# Patient Record
Sex: Male | Born: 1980
Health system: Southern US, Community
[De-identification: ages and names within clinical notes are randomized; demographics above are authoritative.]

## PROBLEM LIST (undated history)

## (undated) DIAGNOSIS — T7840XA Allergy, unspecified, initial encounter: Secondary | ICD-10-CM

## (undated) DIAGNOSIS — F32A Depression, unspecified: Secondary | ICD-10-CM

## (undated) DIAGNOSIS — F4310 Post-traumatic stress disorder, unspecified: Secondary | ICD-10-CM

## (undated) DIAGNOSIS — M1990 Unspecified osteoarthritis, unspecified site: Secondary | ICD-10-CM

## (undated) DIAGNOSIS — G4730 Sleep apnea, unspecified: Secondary | ICD-10-CM

## (undated) DIAGNOSIS — F419 Anxiety disorder, unspecified: Secondary | ICD-10-CM

## (undated) DIAGNOSIS — K219 Gastro-esophageal reflux disease without esophagitis: Secondary | ICD-10-CM

## (undated) DIAGNOSIS — F329 Major depressive disorder, single episode, unspecified: Secondary | ICD-10-CM

## (undated) HISTORY — DX: Post-traumatic stress disorder, unspecified: F43.10

## (undated) HISTORY — DX: Gastro-esophageal reflux disease without esophagitis: K21.9

## (undated) HISTORY — DX: Major depressive disorder, single episode, unspecified: F32.9

## (undated) HISTORY — DX: Unspecified osteoarthritis, unspecified site: M19.90

## (undated) HISTORY — DX: Depression, unspecified: F32.A

## (undated) HISTORY — DX: Allergy, unspecified, initial encounter: T78.40XA

## (undated) HISTORY — DX: Sleep apnea, unspecified: G47.30

## (undated) HISTORY — DX: Anxiety disorder, unspecified: F41.9

---

## 2011-03-16 ENCOUNTER — Ambulatory Visit (HOSPITAL_COMMUNITY): Payer: BC Managed Care – PPO | Admitting: Psychology

## 2011-03-16 DIAGNOSIS — F321 Major depressive disorder, single episode, moderate: Secondary | ICD-10-CM

## 2011-04-06 ENCOUNTER — Encounter (HOSPITAL_COMMUNITY): Payer: BC Managed Care – PPO | Admitting: Psychology

## 2011-04-06 DIAGNOSIS — F4325 Adjustment disorder with mixed disturbance of emotions and conduct: Secondary | ICD-10-CM

## 2011-04-27 ENCOUNTER — Encounter (HOSPITAL_COMMUNITY): Payer: BC Managed Care – PPO | Admitting: Psychology

## 2011-05-08 ENCOUNTER — Encounter (HOSPITAL_COMMUNITY): Payer: BC Managed Care – PPO | Admitting: Psychology

## 2011-05-08 DIAGNOSIS — F332 Major depressive disorder, recurrent severe without psychotic features: Secondary | ICD-10-CM

## 2011-05-22 ENCOUNTER — Encounter (HOSPITAL_COMMUNITY): Payer: BC Managed Care – PPO | Admitting: Psychology

## 2011-07-31 ENCOUNTER — Ambulatory Visit (INDEPENDENT_AMBULATORY_CARE_PROVIDER_SITE_OTHER): Payer: BC Managed Care – PPO | Admitting: Psychology

## 2011-07-31 DIAGNOSIS — F329 Major depressive disorder, single episode, unspecified: Secondary | ICD-10-CM
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
## 2011-09-04 ENCOUNTER — Ambulatory Visit (HOSPITAL_COMMUNITY): Payer: BC Managed Care – PPO | Admitting: Psychology

## 2011-10-02 ENCOUNTER — Telehealth (HOSPITAL_COMMUNITY): Payer: Self-pay | Admitting: Psychology

## 2011-10-02 NOTE — Telephone Encounter (Signed)
See telephone note.

## 2011-12-10 ENCOUNTER — Ambulatory Visit (HOSPITAL_COMMUNITY): Payer: BC Managed Care – PPO | Admitting: Psychiatry

## 2012-01-09 ENCOUNTER — Ambulatory Visit (HOSPITAL_COMMUNITY): Payer: BC Managed Care – PPO | Admitting: Psychiatry

## 2012-01-09 ENCOUNTER — Encounter (HOSPITAL_COMMUNITY): Payer: Self-pay | Admitting: Psychiatry

## 2012-01-09 DIAGNOSIS — F39 Unspecified mood [affective] disorder: Secondary | ICD-10-CM

## 2012-01-09 DIAGNOSIS — F4310 Post-traumatic stress disorder, unspecified: Secondary | ICD-10-CM
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
## 2012-01-16 ENCOUNTER — Encounter (HOSPITAL_COMMUNITY): Payer: Self-pay | Admitting: Psychology

## 2012-01-16 ENCOUNTER — Ambulatory Visit (INDEPENDENT_AMBULATORY_CARE_PROVIDER_SITE_OTHER): Payer: BC Managed Care – PPO | Admitting: Psychology

## 2012-01-16 DIAGNOSIS — F4310 Post-traumatic stress disorder, unspecified: Secondary | ICD-10-CM

## 2012-01-16 DIAGNOSIS — F39 Unspecified mood [affective] disorder: Secondary | ICD-10-CM
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
## 2012-02-19 ENCOUNTER — Telehealth (HOSPITAL_COMMUNITY): Payer: Self-pay | Admitting: *Deleted

## 2012-03-17 ENCOUNTER — Telehealth (HOSPITAL_COMMUNITY): Payer: Self-pay | Admitting: *Deleted

## 2012-03-17 ENCOUNTER — Telehealth (HOSPITAL_COMMUNITY): Payer: Self-pay

## 2012-03-17 ENCOUNTER — Other Ambulatory Visit (HOSPITAL_COMMUNITY): Payer: Self-pay | Admitting: Psychiatry

## 2012-03-17 NOTE — Telephone Encounter (Signed)
Patient states he a pretty severe - in his words - dissociative episode on Saturday 7/27.Patient states this has happened before, but not this bad. Stated he woke up Saturday afternoon on the couch, thinking he had taken an hour nap. His wife was on the phone with her mother crying. His wife told him he had been talking with her for about an hour, in the third person. He says that he has had these types of episodes in the past, but they are increasing. States he has made the hour drive home from work with no recollection of driving. He reports that he is becoming more frightened by this. He states he saw Dr.Rodenbough as recommended for testing and had a sleep study as suggested. His MD recommended a second sleep study,scheduled for September.  He would like an appointment today if possible or a phone call from Dr.Arfeen

## 2012-03-17 NOTE — Telephone Encounter (Signed)
1400:contacted patient to let him know Dr.Arfeen would not be able to see him today, but would schedule appt with him on Wednesday 7/31. Instructed patient if he feels worse before the appt he can go to the ED or come to [facility].  Patient states he will be here, but requested this writer contact his wife to tell her of Dr.Arfeen's instructions. Confirmed with patient.

## 2012-03-17 NOTE — Telephone Encounter (Signed)
Contacted patient's wife at his request. Informed spouse of pt appt on Wednesday. Wife described patient's behavior on Saturday as very strange. Stated he referred to himself as the "other Mical", and said he acted differently, more "cocky". Stated he was very different, and it frightened her that he had no memory of the conversation after he woke up. Instructed wife that Dr.Arfeen recommended pt to come to ED or [facility] if symptoms worsen between. Also instructed wife that she can call 911 if she feels pt is any danger to himself or others. Wife states she wants to make sure MD understands how strange pt's behavior was on Saturday. Advised wife that if pt agrees, she can accompany him to appt on Wednesday.

## 2012-03-17 NOTE — Telephone Encounter (Signed)
03/17/12 PT CALLED THIS MORNING STATING THAT OVER THE WEEKEND THAT HE WAS SPEAKING TO HIS WIFE IN THE THIRD PERSON AND WOKE UP. - HAD LEFT A VM ON 07/28 @ 11:17AM - I (Teddy Rebstock) SAW THAT THE PT LIVED IN [place] - INFORMED PT THAT WE HAD AN [place] OFFICE - PT WASN'T AWARE GAVE NUMBER TO CALL.Marguerite Olea

## 2012-03-18 ENCOUNTER — Other Ambulatory Visit (HOSPITAL_COMMUNITY): Payer: Self-pay | Admitting: Psychiatry

## 2012-03-18 NOTE — Telephone Encounter (Signed)
Spoke to patient.  He is coming tomorrow with his wife for his appointment.

## 2012-03-19 ENCOUNTER — Encounter (HOSPITAL_COMMUNITY): Payer: Self-pay | Admitting: Psychiatry

## 2012-03-19 ENCOUNTER — Telehealth (HOSPITAL_COMMUNITY): Payer: Self-pay | Admitting: *Deleted

## 2012-03-19 ENCOUNTER — Ambulatory Visit (INDEPENDENT_AMBULATORY_CARE_PROVIDER_SITE_OTHER): Payer: BC Managed Care – PPO | Admitting: Psychiatry

## 2012-03-19 VITALS — BP 136/91 | HR 75 | Wt 269.4 lb

## 2012-03-19 DIAGNOSIS — F39 Unspecified mood [affective] disorder: Secondary | ICD-10-CM

## 2012-03-19 DIAGNOSIS — F440 Dissociative amnesia: Secondary | ICD-10-CM

## 2012-03-19 DIAGNOSIS — F4310 Post-traumatic stress disorder, unspecified: Secondary | ICD-10-CM

## 2012-03-19 MED ORDER — PAROXETINE HCL 10 MG PO TABS: ORAL_TABLET | ORAL | Status: DC
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
## 2012-03-24 ENCOUNTER — Ambulatory Visit (INDEPENDENT_AMBULATORY_CARE_PROVIDER_SITE_OTHER): Payer: BC Managed Care – PPO | Admitting: Psychology

## 2012-03-24 DIAGNOSIS — F449 Dissociative and conversion disorder, unspecified: Secondary | ICD-10-CM

## 2012-03-24 DIAGNOSIS — F4310 Post-traumatic stress disorder, unspecified: Secondary | ICD-10-CM

## 2012-03-25 ENCOUNTER — Telehealth (HOSPITAL_COMMUNITY): Payer: Self-pay | Admitting: *Deleted

## 2012-03-25 NOTE — Telephone Encounter (Signed)
[id number] wife left VM with information to be given to Dr.Arfeen regarding her husband. Found some year old emails written by Casimiro Needle in which he made comments regarding his marriage and children. Wife interpreted them as derogatory. Does not know if this is something related to his illness or not. Expressed concern if husband found out she had told MD this information. Informed Dr.Arfeen of wife's information. Dr.Arfeen asked that wife be contacted and informed that information was received.

## 2012-03-25 NOTE — Telephone Encounter (Signed)
Left VM for wife acknowledging information.

## 2012-03-28 ENCOUNTER — Encounter (HOSPITAL_COMMUNITY): Payer: Self-pay | Admitting: Psychology
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
## 2012-04-02 ENCOUNTER — Ambulatory Visit (INDEPENDENT_AMBULATORY_CARE_PROVIDER_SITE_OTHER): Payer: BC Managed Care – PPO | Admitting: Psychiatry

## 2012-04-02 ENCOUNTER — Encounter (HOSPITAL_COMMUNITY): Payer: Self-pay | Admitting: Psychiatry

## 2012-04-02 DIAGNOSIS — F39 Unspecified mood [affective] disorder: Secondary | ICD-10-CM

## 2012-04-02 DIAGNOSIS — F4310 Post-traumatic stress disorder, unspecified: Secondary | ICD-10-CM

## 2012-04-02 DIAGNOSIS — F440 Dissociative amnesia: Secondary | ICD-10-CM

## 2012-04-02 MED ORDER — PAROXETINE HCL 30 MG PO TABS: 30.0000 mg | ORAL_TABLET | Freq: Every day | ORAL | Status: DC
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
## 2012-04-09 ENCOUNTER — Ambulatory Visit (INDEPENDENT_AMBULATORY_CARE_PROVIDER_SITE_OTHER): Payer: BC Managed Care – PPO | Admitting: Marriage and Family Therapist

## 2012-04-09 DIAGNOSIS — F449 Dissociative and conversion disorder, unspecified: Secondary | ICD-10-CM

## 2012-04-09 DIAGNOSIS — F4310 Post-traumatic stress disorder, unspecified: Secondary | ICD-10-CM
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
## 2012-04-16 ENCOUNTER — Ambulatory Visit (HOSPITAL_COMMUNITY): Payer: Self-pay | Admitting: Marriage and Family Therapist

## 2012-04-24 ENCOUNTER — Ambulatory Visit (INDEPENDENT_AMBULATORY_CARE_PROVIDER_SITE_OTHER): Payer: BC Managed Care – PPO | Admitting: Marriage and Family Therapist

## 2012-04-24 DIAGNOSIS — F449 Dissociative and conversion disorder, unspecified: Secondary | ICD-10-CM

## 2012-04-24 DIAGNOSIS — F4310 Post-traumatic stress disorder, unspecified: Secondary | ICD-10-CM
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
## 2012-04-30 ENCOUNTER — Ambulatory Visit (HOSPITAL_COMMUNITY): Payer: Self-pay | Admitting: Marriage and Family Therapist

## 2012-05-01 ENCOUNTER — Ambulatory Visit (INDEPENDENT_AMBULATORY_CARE_PROVIDER_SITE_OTHER): Payer: BC Managed Care – PPO | Admitting: Marriage and Family Therapist

## 2012-05-01 DIAGNOSIS — F4310 Post-traumatic stress disorder, unspecified: Secondary | ICD-10-CM

## 2012-05-01 DIAGNOSIS — F449 Dissociative and conversion disorder, unspecified: Secondary | ICD-10-CM
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
## 2012-05-07 ENCOUNTER — Ambulatory Visit (HOSPITAL_COMMUNITY): Payer: Self-pay | Admitting: Marriage and Family Therapist

## 2012-05-08 ENCOUNTER — Ambulatory Visit (INDEPENDENT_AMBULATORY_CARE_PROVIDER_SITE_OTHER): Payer: BC Managed Care – PPO | Admitting: Marriage and Family Therapist

## 2012-05-08 DIAGNOSIS — F4310 Post-traumatic stress disorder, unspecified: Secondary | ICD-10-CM

## 2012-05-08 DIAGNOSIS — F449 Dissociative and conversion disorder, unspecified: Secondary | ICD-10-CM
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
## 2012-05-14 ENCOUNTER — Encounter (HOSPITAL_COMMUNITY): Payer: Self-pay | Admitting: Psychiatry

## 2012-05-14 ENCOUNTER — Ambulatory Visit (INDEPENDENT_AMBULATORY_CARE_PROVIDER_SITE_OTHER): Payer: BC Managed Care – PPO | Admitting: Psychiatry

## 2012-05-14 VITALS — Wt 264.0 lb

## 2012-05-14 DIAGNOSIS — F4310 Post-traumatic stress disorder, unspecified: Secondary | ICD-10-CM

## 2012-05-14 DIAGNOSIS — F39 Unspecified mood [affective] disorder: Secondary | ICD-10-CM

## 2012-05-14 DIAGNOSIS — F440 Dissociative amnesia: Secondary | ICD-10-CM

## 2012-05-14 MED ORDER — PAROXETINE HCL 40 MG PO TABS: 40.0000 mg | ORAL_TABLET | Freq: Every day | ORAL | Status: DC
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
## 2012-05-21 ENCOUNTER — Ambulatory Visit (HOSPITAL_COMMUNITY): Payer: Self-pay | Admitting: Marriage and Family Therapist

## 2012-05-22 ENCOUNTER — Ambulatory Visit (INDEPENDENT_AMBULATORY_CARE_PROVIDER_SITE_OTHER): Payer: BC Managed Care – PPO | Admitting: Marriage and Family Therapist

## 2012-05-22 DIAGNOSIS — F4310 Post-traumatic stress disorder, unspecified: Secondary | ICD-10-CM

## 2012-05-22 DIAGNOSIS — F449 Dissociative and conversion disorder, unspecified: Secondary | ICD-10-CM
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
## 2012-05-28 ENCOUNTER — Ambulatory Visit (HOSPITAL_COMMUNITY): Payer: Self-pay | Admitting: Marriage and Family Therapist

## 2012-05-29 ENCOUNTER — Ambulatory Visit (INDEPENDENT_AMBULATORY_CARE_PROVIDER_SITE_OTHER): Payer: BC Managed Care – PPO | Admitting: Marriage and Family Therapist

## 2012-05-29 DIAGNOSIS — F4310 Post-traumatic stress disorder, unspecified: Secondary | ICD-10-CM

## 2012-05-29 DIAGNOSIS — F449 Dissociative and conversion disorder, unspecified: Secondary | ICD-10-CM

## 2012-05-29 NOTE — Progress Notes (Signed)
   THERAPIST PROGRESS NOTE  Session Time:  9:00 - 10:00 a.m.  Participation Level: Active  Behavioral Response: CasualAlertAnxious  Type of Therapy: Individual Therapy  Treatment Goals addressed: Coping  Interventions: Strength-based, Assertiveness Training, Supportive and Family Systems  Summary: Glenn Mendoza is a 31 y.o. male who presents with PTSD and dissociative disorder.  He was referred by Dr. Lolly Mustache.  Patient reports he has been keeping busy, something he does to distract himself from strong feelings.  For example, patient talked about his dissociative episodes where he would "blackout" at times for hours to days not knowing what he did.  Patient reports he feels hopeless about the blackouts i.e., "I'll never figure out what I did," but is able to distract his hopelessness by doing other things i.e., woodcutting, smoking, going on the Internet.  Patient is doing a lot of woodcutting right now and enjoying it.  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  Patient said he believed it would be cathartic for him.  Patient also states he stopped doing it because his wife discouraged his acting.    Suicidal/Homicidal: Nowithout intent/plan  Therapist Response:  Discussed patient's dissociating and educated patient about dissociation on a continuum with daydreaming on one end and blackouts becoming someone else on the other. Discussed patient stopping acting as a way to accommodate himself, something he tends to do with others.  Encouraged acting as an avenue to help patient heal from his trauma.  Also educated patient about types of communication and patient was able to identify passive-aggressive communication as his style.  Gave patient handouts on communication, conflict resolution.  Also completed a substance abuse assessment.  It appears that patient has no substance abuse problem.  He has tried marijuana four times in  college, before his children were born drank several weekends  In a row anywhere from four to six 12 oz. Beers.  He drank beer and hard liquor, with his last drink being September of 2012.  he reports never using any other substance.  Homework:  Patient will review the communication handouts.   Plan: Return again in 1 weeks.  Diagnosis: Axis I: PTSD; dissociative d/o    Axis II: Deferred    Tzivia Oneil, LMFT 05/29/2012

## 2012-06-05 ENCOUNTER — Ambulatory Visit (INDEPENDENT_AMBULATORY_CARE_PROVIDER_SITE_OTHER): Payer: BC Managed Care – PPO | Admitting: Marriage and Family Therapist

## 2012-06-05 DIAGNOSIS — F4310 Post-traumatic stress disorder, unspecified: Secondary | ICD-10-CM

## 2012-06-05 DIAGNOSIS — F449 Dissociative and conversion disorder, unspecified: Secondary | ICD-10-CM
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
## 2012-06-10 ENCOUNTER — Ambulatory Visit (HOSPITAL_COMMUNITY): Payer: Self-pay | Admitting: Marriage and Family Therapist

## 2012-06-13 ENCOUNTER — Ambulatory Visit (INDEPENDENT_AMBULATORY_CARE_PROVIDER_SITE_OTHER): Payer: BC Managed Care – PPO | Admitting: Marriage and Family Therapist

## 2012-06-13 DIAGNOSIS — F4310 Post-traumatic stress disorder, unspecified: Secondary | ICD-10-CM

## 2012-06-13 DIAGNOSIS — F449 Dissociative and conversion disorder, unspecified: Secondary | ICD-10-CM
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
## 2012-06-17 ENCOUNTER — Ambulatory Visit (HOSPITAL_COMMUNITY): Payer: Self-pay | Admitting: Marriage and Family Therapist

## 2012-06-17 ENCOUNTER — Telehealth (HOSPITAL_COMMUNITY): Payer: Self-pay | Admitting: Marriage and Family Therapist

## 2012-06-23 ENCOUNTER — Other Ambulatory Visit (HOSPITAL_COMMUNITY): Payer: Self-pay | Admitting: *Deleted

## 2012-06-23 DIAGNOSIS — F4310 Post-traumatic stress disorder, unspecified: Secondary | ICD-10-CM

## 2012-06-23 MED ORDER — PAROXETINE HCL 40 MG PO TABS: 40.0000 mg | ORAL_TABLET | Freq: Every day | ORAL | Status: DC

## 2012-06-23 NOTE — Telephone Encounter (Signed)
Authorized by [employer] for 30 days with appt reminder

## 2012-06-24 ENCOUNTER — Ambulatory Visit (INDEPENDENT_AMBULATORY_CARE_PROVIDER_SITE_OTHER): Payer: BC Managed Care – PPO | Admitting: Marriage and Family Therapist

## 2012-06-24 DIAGNOSIS — F4310 Post-traumatic stress disorder, unspecified: Secondary | ICD-10-CM

## 2012-06-24 DIAGNOSIS — F449 Dissociative and conversion disorder, unspecified: Secondary | ICD-10-CM
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
## 2012-07-01 ENCOUNTER — Ambulatory Visit (INDEPENDENT_AMBULATORY_CARE_PROVIDER_SITE_OTHER): Payer: BC Managed Care – PPO | Admitting: Marriage and Family Therapist

## 2012-07-01 DIAGNOSIS — F449 Dissociative and conversion disorder, unspecified: Secondary | ICD-10-CM

## 2012-07-01 DIAGNOSIS — F4310 Post-traumatic stress disorder, unspecified: Secondary | ICD-10-CM

## 2012-07-01 NOTE — Progress Notes (Signed)
   THERAPIST PROGRESS NOTE  Session Time:  8:00 - 9:00 a.m.  Participation Level: Active  Behavioral Response: CasualAlertAnxious  Type of Therapy: Individual Therapy  Treatment Goals addressed: Coping  Interventions: Strength-based, Assertiveness Training, Supportive and Family Systems  Summary: Glenn Mendoza is a 31 y.o. male who presents with PTSD and dissociative disorder.  He was referred by Dr. Lolly Mustache.  He did reports his anxiety has been about a "5-6" on 0-10 scale this past week.  Patient admits he does not know what it would be like to not be anxious.  Patient first reported that he is noticing that his "insides are matching his outsides."  He talked about his thinking, behavior, and feeling matching the situation he is experiencing.  He reports noticing that people around him are responding more positive than in the past as an indicator that he is "onto something."  Patient also took a great deal of the session discussing how his relationship with his wife being "cordial" and that they are in the process of writing their separation agreement.  He also reports having another overnight with his children at his parents' home.  He states he has not spoken to his wife about coming in for a session.  He also talked about his marriage specifically relating to "I know my wife is not the main reason for the breakup, that my PTSD was a big factor, but she did not make it any easier for me."     Suicidal/Homicidal: Nowithout intent/plan  Therapist Response:  Discussed dissociative disorder relating to patient splitting off thinking, behavior, and feelings in order to cope with trauma.  Discussed with patient how his wife coped with his PTSD as a "triggering agent" relating to his symptoms.  However also discussed that it was and is his responsibility to decide how he wants to react not only to his wife but also anyone else in his life.  Talked about patient balancing his life with play and  socializing and patient states he has been writing and hanging with old high school friends.  Discussed patient talking to Dr. Lolly Mustache about how he is doing and what happened to get him into therapy (what specifically wife was experiencing relating to safety).  He will be seeing Dr. Lolly Mustache the end of this week.  Plan: Return again in 1 weeks.  Diagnosis: Axis I: PTSD; Dissociative d/o    Axis II: Deferred    Jefte Carithers, LMFT 07/01/2012

## 2012-07-03 ENCOUNTER — Ambulatory Visit (HOSPITAL_COMMUNITY): Payer: BC Managed Care – PPO | Admitting: Psychiatry

## 2012-07-08 ENCOUNTER — Ambulatory Visit (INDEPENDENT_AMBULATORY_CARE_PROVIDER_SITE_OTHER): Payer: BC Managed Care – PPO | Admitting: Marriage and Family Therapist

## 2012-07-08 DIAGNOSIS — F4310 Post-traumatic stress disorder, unspecified: Secondary | ICD-10-CM

## 2012-07-08 DIAGNOSIS — F449 Dissociative and conversion disorder, unspecified: Secondary | ICD-10-CM
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
## 2012-07-09 ENCOUNTER — Encounter (HOSPITAL_COMMUNITY): Payer: Self-pay | Admitting: Psychiatry

## 2012-07-09 ENCOUNTER — Ambulatory Visit (INDEPENDENT_AMBULATORY_CARE_PROVIDER_SITE_OTHER): Payer: BC Managed Care – PPO | Admitting: Psychiatry

## 2012-07-09 DIAGNOSIS — F4310 Post-traumatic stress disorder, unspecified: Secondary | ICD-10-CM

## 2012-07-09 MED ORDER — PAROXETINE HCL 40 MG PO TABS: 40.0000 mg | ORAL_TABLET | Freq: Every day | ORAL | Status: DC
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
## 2012-07-24 ENCOUNTER — Ambulatory Visit (INDEPENDENT_AMBULATORY_CARE_PROVIDER_SITE_OTHER): Payer: BC Managed Care – PPO | Admitting: Marriage and Family Therapist

## 2012-07-24 DIAGNOSIS — F4310 Post-traumatic stress disorder, unspecified: Secondary | ICD-10-CM

## 2012-07-24 DIAGNOSIS — F449 Dissociative and conversion disorder, unspecified: Secondary | ICD-10-CM

## 2012-07-24 NOTE — Progress Notes (Signed)
   THERAPIST PROGRESS NOTE  Session Time:  9:00 - 10:00 a.m.  Participation Level: Active  Behavioral Response: CasualAlertAnxious (mild)  Type of Therapy: Individual Therapy  Treatment Goals addressed: Coping  Interventions: Strength-based and Supportive  Summary: Glenn Mendoza is a 31 y.o. male who presents with PTSD and dissociative disorder.  He was referred by Dr. Lolly Mustache.  Patient reports he has been having less and less PTSD symptoms and anxiety.  He reports he has been focusing primarily on creativity and how to access same.  He reports he will be doing a Pod-cast of one of his stories with a friend from high school and he is enjoying creating it as well as spending time with his friend.  Patient disclosed that in his marriage he cut himself off from friendships and this friend's 66 y/o son is patient's Godchild.  Patient reports he has not been a good Godfather since he has spent no time with the boy.  Suicidal/Homicidal: Negativewithout intent/plan  Therapist Response:  Discussed not writing a letter concerning patient being safe with his children but rather his need to talk to his wife about coming in for a session to discuss patient's safety.  Patient did talk to his wife and she is amenable to this idea.  Discussed patient renewing his relationship with his [name].  Also talked about the gifts patient has had because of his trauma background e.g., patient's creativity, his ability to access emotions quickly.  Suggested patient think seriously about what gifts he got from experiencing trauma.  Also talked as well about patient not defining himself by his trauma but rather look at it as something that happened not who he is.  Plan: Return again in 1 weeks.  Diagnosis: Axis I: PTSD; Dissociative d/o    Axis II: Deferred    [place] Mckeough, LMFT 07/24/2012

## 2012-07-25 ENCOUNTER — Other Ambulatory Visit (HOSPITAL_COMMUNITY): Payer: Self-pay | Admitting: Psychiatry

## 2012-07-31 ENCOUNTER — Ambulatory Visit (INDEPENDENT_AMBULATORY_CARE_PROVIDER_SITE_OTHER): Payer: BC Managed Care – PPO | Admitting: Marriage and Family Therapist

## 2012-07-31 DIAGNOSIS — F449 Dissociative and conversion disorder, unspecified: Secondary | ICD-10-CM

## 2012-07-31 DIAGNOSIS — F4310 Post-traumatic stress disorder, unspecified: Secondary | ICD-10-CM

## 2012-07-31 NOTE — Progress Notes (Signed)
   THERAPIST PROGRESS NOTE  Session Time:  8:00 - 9:00 a.m.  Participation Level: Active  Behavioral Response: CasualAlertAnxious (mild)  Type of Therapy: Individual Therapy  Treatment Goals addressed: Coping  Interventions: Strength-based, Biofeedback and Supportive  Summary: Glenn Mendoza is a 31 y.o. male who presents with PTSD and dissociative d/o.  He was referred by Dr. Lolly Mustache.  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  He reports it has been helpful to his mental health to be able to continue to do "whatever he wants to do" without worrying about what others will say.  He also reports he has started dating but is "taking it slow."  He reports this is interesting since the last time he dated he was in high school.  Suicidal/Homicidal: NAwithout intent/plan  Therapist Response:  Discussed why patient's creativity is so important at this time.  Specifically began to educated patient about biofeedback and how learning biofeedback can help patient creatively but also help patient with his anxiety and level of stress.  Gave patient brochure on biofeedback and explained the process of same.  Also discussed patient dating.  Specifically discussed patient using his assertive techniques with others, and how to trust his instincts when something does not feel right but also to follow his instincts.  Patient stated it was a good session.  Plan: Return again in 1 weeks.  Diagnosis: Axis I: PTSD; Dissociative D/O    Axis II: Deferred    [name], LMFT 07/31/2012
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
## 2012-08-07 ENCOUNTER — Ambulatory Visit (INDEPENDENT_AMBULATORY_CARE_PROVIDER_SITE_OTHER): Payer: BC Managed Care – PPO | Admitting: Marriage and Family Therapist

## 2012-08-07 DIAGNOSIS — F4310 Post-traumatic stress disorder, unspecified: Secondary | ICD-10-CM

## 2012-08-07 DIAGNOSIS — F449 Dissociative and conversion disorder, unspecified: Secondary | ICD-10-CM

## 2012-09-02 ENCOUNTER — Ambulatory Visit (INDEPENDENT_AMBULATORY_CARE_PROVIDER_SITE_OTHER): Payer: BC Managed Care – PPO | Admitting: Marriage and Family Therapist

## 2012-09-02 DIAGNOSIS — F449 Dissociative and conversion disorder, unspecified: Secondary | ICD-10-CM

## 2012-09-02 DIAGNOSIS — F4310 Post-traumatic stress disorder, unspecified: Secondary | ICD-10-CM

## 2012-09-02 NOTE — Progress Notes (Signed)
   THERAPIST PROGRESS NOTE  Session Time:  9:00 - 10:00 a.m.  Participation Level: Active  Behavioral Response: CasualAlertAnxious (mild)  Type of Therapy: Individual Therapy  Treatment Goals addressed: Coping  Interventions: Strength-based, Supportive and Family Systems  Summary: Glenn Mendoza is a 32 y.o. male who presents with  PTSD and dissociative disorder.  He was referred by Dr. Lolly Mustache.  Patient reports he has been feeling better over the past month.  He reports it is due to being back in his home.  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  He also states he and his wife are getting along well around his seeing his children.  He reports his wife told him she cannot come in due to finances but would talk on the telephone.  He talked about what they have decided relating to patient seeing his children as a long-term game plan if patient continues to get better.  Patient was 15 minutes late for his session due to work.  Suicidal/Homicidal: NAwithout intent/plan  Therapist Response:  Rated patient's symptoms:  Depression 1 on 0-10 scale; anxiety 2 (over money).  He reports no suicidal or homicidal thoughts as well as no dissociative episodes.  Talked about what being in the play could mean to his self-esteem and self-healing.  Discussed how to proceed regarding the telephone call with his wife including going over several questions she may be asking including whether or not patient is safe to be alone with the children.  Patient will sign a release for his wife.  Overall right now patient's symptoms continue to improve.  Plan: Return again in 1 weeks.  Diagnosis: Axis I: PTSD; Disociative disorder.    Axis II: Deferred    Glenn Convery, LMFT, CTS 09/02/2012

## 2012-09-08 ENCOUNTER — Ambulatory Visit (INDEPENDENT_AMBULATORY_CARE_PROVIDER_SITE_OTHER): Payer: BC Managed Care – PPO | Admitting: Psychiatry

## 2012-09-08 ENCOUNTER — Encounter (HOSPITAL_COMMUNITY): Payer: Self-pay | Admitting: Psychiatry

## 2012-09-08 DIAGNOSIS — F39 Unspecified mood [affective] disorder: Secondary | ICD-10-CM

## 2012-09-08 DIAGNOSIS — F4310 Post-traumatic stress disorder, unspecified: Secondary | ICD-10-CM

## 2012-09-08 MED ORDER — PAROXETINE HCL 40 MG PO TABS: 40.0000 mg | ORAL_TABLET | Freq: Every day | ORAL | Status: DC
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
## 2012-09-09 ENCOUNTER — Ambulatory Visit (INDEPENDENT_AMBULATORY_CARE_PROVIDER_SITE_OTHER): Payer: BC Managed Care – PPO | Admitting: Marriage and Family Therapist

## 2012-09-09 DIAGNOSIS — F4310 Post-traumatic stress disorder, unspecified: Secondary | ICD-10-CM

## 2012-09-09 DIAGNOSIS — F449 Dissociative and conversion disorder, unspecified: Secondary | ICD-10-CM

## 2012-09-09 NOTE — Progress Notes (Signed)
   THERAPIST PROGRESS NOTE  Session Time:  9:00 - 10:00 a.m.  Participation Level: Active  Behavioral Response: CasualAlertAnxious  Type of Therapy: Individual Therapy  Treatment Goals addressed: Coping  Interventions: Strength-based, Supportive and Family Systems  Summary: Glenn Mendoza is a 32 y.o. male who presents with PTSD & dissociative d/o.  He was referred by Dr. Lolly Mustache.  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  He also talked about other activities he is involving himself in having to do with his creative process.  Patient talked about having his children overnight at his parents' house for the entire weekend.  He reports his daughter only stayed one night because she was missing her mother.  He also talked about his financial situation relating to providing support for his children and giving money for extra items for the children.  He said he wanted to talk about how to provide financially in what would be fair to him and his children.  He also talked about finding a part-time job to supplement his income.  Patient talked about his appointment with Dr. Lolly Mustache.  Suicidal/Homicidal: NA  Therapist Response:  Assessed patient's level of PTSD symptoms.  According to patient they are minimal and have been for several weeks.  He is very focused on getting out and doing things and not thinking about whether or not he can do them according to having children in the house (it is more difficult to get out of the house with children).  Discussed with patient systemically how separation hurts families financially, particularly the person who has the children 24/7.  Suggested he consider looking at "what is in the best interest of the children?" versus "the money I am giving my wife."  Also talked about patient signing a  release for this writer to talk to his wife regarding her concerns about patient having the children alone.  Also talked about patient continuing to improve and the idea of him coming in every other week.  Gave information on how this process would go e.g., patient will check in on the weeks he is not here and this could be tried for one month.  Patient will think about whether or not he feels comfortable cutting down sessions to every other week.  Plan: Return again in 1 weeks.  Diagnosis: Axis I: PTSD; Dissociative d/o    Axis II: Deferred    [name], LMFT 09/09/2012

## 2012-09-09 NOTE — Telephone Encounter (Signed)
No contact - erroneous encounter 

## 2012-09-18 ENCOUNTER — Ambulatory Visit (HOSPITAL_COMMUNITY): Payer: Self-pay | Admitting: Marriage and Family Therapist

## 2012-09-25 ENCOUNTER — Ambulatory Visit (INDEPENDENT_AMBULATORY_CARE_PROVIDER_SITE_OTHER): Payer: BC Managed Care – PPO | Admitting: Marriage and Family Therapist

## 2012-09-25 DIAGNOSIS — F4310 Post-traumatic stress disorder, unspecified: Secondary | ICD-10-CM

## 2012-09-25 DIAGNOSIS — F449 Dissociative and conversion disorder, unspecified: Secondary | ICD-10-CM

## 2012-09-25 NOTE — Progress Notes (Signed)
   THERAPIST PROGRESS NOTE  Session Time:  9:00 - 10:00 a.m.  Participation Level: Active  Behavioral Response: CasualAlertAnxious (mild)  Type of Therapy: Individual Therapy  Treatment Goals addressed: Coping  Interventions: Strength-based and Supportive  Summary: Glenn Mendoza is a 32 y.o. male who presents with PTSD and dissociative disorder.  He was referred by Dr. Lolly Mustache.  Patient reports that although there are some times when he is stressed, overall he cannot believe how his life has changed for the better.  His stressors are financial and getting along with his wife during this separation.  He reports this week he filed for bankruptcy.  He reports he may have to get a part-time job on top of his full-time job.  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  He is very excited about this.  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  Overall patient has not specifically talked about his childhood trauma for some time.  Suicidal/Homicidal: NA  Therapist Response:  Assessed patient's symptoms of PTSD and dissociation and there appears to be none at this time.  Patient is living his life and enjoying what he is doing.  Did talk to him about being careful working two jobs with his mental health symptoms.  Discussed patient going down to every other week and patient says he is ready to slow down therapy.  He will if he needs to call.  Plan: Return again in 2 weeks.  Diagnosis: Axis I: PTSD; Dissociative d/o    Axis II: Deferred    Clariece Roesler, LMFT, CTS 09/25/2012

## 2012-10-02 ENCOUNTER — Ambulatory Visit (HOSPITAL_COMMUNITY): Payer: Self-pay | Admitting: Marriage and Family Therapist

## 2012-10-09 ENCOUNTER — Ambulatory Visit (INDEPENDENT_AMBULATORY_CARE_PROVIDER_SITE_OTHER): Payer: BC Managed Care – PPO | Admitting: Marriage and Family Therapist

## 2012-10-09 DIAGNOSIS — F4310 Post-traumatic stress disorder, unspecified: Secondary | ICD-10-CM

## 2012-10-09 DIAGNOSIS — F449 Dissociative and conversion disorder, unspecified: Secondary | ICD-10-CM

## 2012-10-09 NOTE — Progress Notes (Signed)
   THERAPIST PROGRESS NOTE  Session Time: 9:00 - 10:00 a.m.  Participation Level: Active  Behavioral Response: CasualAlerthappy  Type of Therapy: Individual Therapy  Treatment Goals addressed: Coping  Interventions: Strength-based and Supportive  Summary: Zyeir Dymek is a 32 y.o. male who presents with PTSD and dissociative disorder.  He was referred by Dr. Lolly Mustache.  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  He also reports meeting new people, gaining new connections, and he is having fun.  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  He will be attending the group every month.  He admits having no anxiety or feeling stressful except in trying to find time to do all of these new activities.  He did admit to feeling sadness over trying to juggle activities with seeing his children.  He talked about a year ago "wanting to run his car off a bridge" and now is the happiest he has been in a long time.  This time for him is the most satisfying he has experienced.  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    Suicidal/Homicidal: Nowithout intent/plan  Therapist Response:  Discussed how far patient has come in his recovery.  Encouraged him to continue on the path he is on.  Discussed patient's wife requesting an email relating to his progress and this writer's refusal to write an email of this nature.  Patient states he understands.  Discussed how to balance work, fun, hobbies, and family.  Overall this patient has done excellent work.  Will continue every other week.  Plan: Return again in 2 weeks.  Diagnosis: Axis I: PTSD; Dissiciative d/o    Axis II: Deferred    Tenaya Hilyer, LMFT, CTS 10/09/2012

## 2012-10-16 ENCOUNTER — Ambulatory Visit (HOSPITAL_COMMUNITY): Payer: Self-pay | Admitting: Marriage and Family Therapist

## 2012-10-30 ENCOUNTER — Ambulatory Visit (INDEPENDENT_AMBULATORY_CARE_PROVIDER_SITE_OTHER): Payer: BC Managed Care – PPO | Admitting: Marriage and Family Therapist

## 2012-10-30 NOTE — Progress Notes (Signed)
   THERAPIST PROGRESS NOTE  Session Time: 3:00 - 4:00 P.M.  Participation Level: Active  Behavioral Response: CasualAlertIrritable  Type of Therapy: Individual Therapy  Treatment Goals addressed: Coping  Interventions: Strength-based and Supportive  Summary: Glenn Mendoza is a 32 y.o. male who presents with PTSD and dissociative disorder.  He was referred by Dr. Lolly Mustache.  Patient first came in stating he was irritated.  He reports his irritation is over his wife not allowing him to see his children overnight.  Patient also states there have been some good things that have happened to him.  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  He has been able to get more hours at work which is helping him financially.  He also reports he has been dating a woman he met on the Internet for about three months.  Patient states he has met her children.   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  Assessed patient's PTSD and level of dissociation and patient states he has had not PTSD symptoms and to his knowledge has had no dissociative episodes.  Discussed how far patient has come in treatment and how he has blossomed since his separation.  Patient started to cry regarding meeting his new friend's children because of how they have warmed up to him and also how he is missing his own children.  Gave patient information on "[employer]," an on-line support group for men who are having difficulty seeing their chidren.  Plan: Return again in 2 weeks.  Diagnosis: Axis I: PTSD; Dissociative d/o    Axis II: Deferred    TOMAR,JOANIE, LMFT, CTS 10/30/2012

## 2012-11-11 ENCOUNTER — Ambulatory Visit (INDEPENDENT_AMBULATORY_CARE_PROVIDER_SITE_OTHER): Payer: BC Managed Care – PPO | Admitting: Psychiatry

## 2012-11-11 ENCOUNTER — Encounter (HOSPITAL_COMMUNITY): Payer: Self-pay | Admitting: Psychiatry

## 2012-11-11 VITALS — Wt 274.6 lb

## 2012-11-11 DIAGNOSIS — F4310 Post-traumatic stress disorder, unspecified: Secondary | ICD-10-CM

## 2012-11-11 DIAGNOSIS — Z79899 Other long term (current) drug therapy: Secondary | ICD-10-CM

## 2012-11-11 MED ORDER — PAROXETINE HCL 40 MG PO TABS
40.0000 mg | ORAL_TABLET | Freq: Every day | ORAL | Status: DC
Start: 2012-11-11 — End: 2012-12-18
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
## 2012-11-13 ENCOUNTER — Ambulatory Visit (INDEPENDENT_AMBULATORY_CARE_PROVIDER_SITE_OTHER): Payer: BC Managed Care – PPO | Admitting: Marriage and Family Therapist

## 2012-11-13 DIAGNOSIS — F449 Dissociative and conversion disorder, unspecified: Secondary | ICD-10-CM

## 2012-11-13 DIAGNOSIS — F4310 Post-traumatic stress disorder, unspecified: Secondary | ICD-10-CM
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
## 2012-11-27 ENCOUNTER — Ambulatory Visit (INDEPENDENT_AMBULATORY_CARE_PROVIDER_SITE_OTHER): Payer: BC Managed Care – PPO | Admitting: Marriage and Family Therapist

## 2012-11-27 DIAGNOSIS — F449 Dissociative and conversion disorder, unspecified: Secondary | ICD-10-CM

## 2012-11-27 DIAGNOSIS — F4310 Post-traumatic stress disorder, unspecified: Secondary | ICD-10-CM

## 2012-11-27 NOTE — Progress Notes (Signed)
   THERAPIST PROGRESS NOTE  Session Time: 2:00 - 3:00 p.m.  Participation Level: Active  Behavioral Response: CasualAlertAnxious (situational)  Type of Therapy: Individual Therapy  Treatment Goals addressed: Coping  Interventions: Strength-based and Supportive  Summary: Glenn Mendoza is a 32 y.o. male who presents with PTSD and dissociative d/o.  He was referred by Dr. Lolly Mustache.  Patient continues to reports improvement in his symptoms.  He reports there are times when he forgets to take his medication in the a.m.  He reports "feeling it" by the end of the day, more depression, not trauma symptoms.  Patient states he has not had a problem with PTSD for a long time.  He also talked about the situation with his wife and seeing the children overnight.  Patient states she finally told him he could have the children overnight at his parents' house.  However, patient admitted 10 days ago he moved in with his GF and her three children.  He states he is not sure how to break the news to his ex-wife for fear that she will not allow the children to see him.  Patient also states he has a lot of anxiety over his ex getting remarried and the children stopping to love patient.    Suicidal/Homicidal: NA  Therapist Response:  Discussed patient's progress.  Discussed the decision to move in with his GF relating to his children.  Patient asked for help in telling his ex that he has moved in with someone.  Also discussed patient having empathy towards his ex because his being in a relationship will make the divorce more real for her and she may be hurt.  Discussed the dynamics of a blended family and patient borrowed the book, "Don't Divorce [place], Advice from Kids about Divorce."  Plan: Return again in 2 weeks.  Diagnosis: Axis I: PTSD; Dissociative d/o    Axis II: Deferred    Glenn Zwilling, LMFT, CTS 11/27/2012

## 2012-12-11 ENCOUNTER — Ambulatory Visit (INDEPENDENT_AMBULATORY_CARE_PROVIDER_SITE_OTHER): Payer: BC Managed Care – PPO | Admitting: Marriage and Family Therapist

## 2012-12-11 ENCOUNTER — Telehealth (HOSPITAL_COMMUNITY): Payer: Self-pay | Admitting: *Deleted

## 2012-12-11 DIAGNOSIS — F449 Dissociative and conversion disorder, unspecified: Secondary | ICD-10-CM

## 2012-12-11 DIAGNOSIS — F4310 Post-traumatic stress disorder, unspecified: Secondary | ICD-10-CM

## 2012-12-11 NOTE — Telephone Encounter (Signed)
Contacted patient at his request (was in office earlier today to see therapist and left message for call concerning his medication). Patient states he needs to speak with MD regarding side effects of Paxil. Having sexual side effects and has read that Paxil may cause problems of this type. Although he says his mood is stable the side effects are a problem. Phone # [phone]

## 2012-12-11 NOTE — Progress Notes (Signed)
   THERAPIST PROGRESS NOTE  Session Time: 3:00 - 4:00 p.m.  Participation Level: Active  Behavioral Response: AlertAnxious  Type of Therapy: Individual Therapy  Treatment Goals addressed: Coping  Interventions: Strength-based and Supportive  Summary: Glenn Mendoza is a 32 y.o. male who presents with PTSD and dissociative disorder.  He was referred by Dr. Lolly Mustache. Patient reports that as far as work and his personal life things are running smoothly.  He states that his wife has been told he is living with someone and the reaction has been surprisingly good at first however about a week later they both had a conversation through texting about the impact of living with someone on their children.  Patient spent part of the session reading the texts between them.  Suicidal/Homicidal: No  Therapist Response:   Discussed patient that the questions his wife was asking were fair and that she was concerned about their children.  Patient stated he agreed since he could imagine what it would be like for him if and when his wife gets involved with someone.  Asked patient if he thought anything about his fear of losing his children to another man and patient stated he no longer felt that way.  Also discussed patient's tendency to "to to appease" (his words) his wife not to upset her.  He admits to having a lot of anxiety around this type of interaction "that went on throughout my marriage."  Discussed patient eventually stopping this behavior.  Finally, assessed patient for dissociative symptoms and patient repeatedly states he has had no episodes in months.  Note:  Part of the texting was about talking to this Clinical research associate about the wife, Victorino Dike, talking to this Clinical research associate regarding whether or not patient was safe around the children.  The wife did call three days ago asking same (and also requesting information regarding patient living with another woman).  Wanted to talk to patient during the session and call wife  during the session so both would hear the information being relayed.  However, patient read that the wife would be taping the conversation.  Spoke to Director Annabell Sabal about discomfort of being taped and we decided there would be no telephone call to the wife because of why she would feel the need to tape the conversation.  Spoke with patient to state same.  Suggested that if his wife wanted a psychiatric opinion to get someone out side of patient's providers to assess for same and patient stated that was a good idea.  Told him that if he needed to talk further to contact this Clinical research associate.  Plan: Return again in 2 weeks.  Diagnosis: Axis I: PTSD; Dissociative disorder    Axis II: Deferred    Shavonn Convey, LMFT, CTS 12/11/2012

## 2012-12-12 ENCOUNTER — Telehealth (HOSPITAL_COMMUNITY): Payer: Self-pay | Admitting: Psychiatry

## 2012-12-12 NOTE — Telephone Encounter (Signed)
Spoke to the patient.  Patient is concerned about the sexual side effects.  I recommend to lower the dose to 20 mg and see if the sexual side effects resolved.  I also warn him worsening of anxiety and depression in that case he needs to call us immediately.

## 2012-12-18 ENCOUNTER — Encounter (HOSPITAL_COMMUNITY): Payer: Self-pay | Admitting: Psychiatry

## 2012-12-18 ENCOUNTER — Ambulatory Visit (INDEPENDENT_AMBULATORY_CARE_PROVIDER_SITE_OTHER): Payer: BC Managed Care – PPO | Admitting: Psychiatry

## 2012-12-18 VITALS — BP 132/87 | HR 100 | Ht 72.0 in | Wt 272.0 lb

## 2012-12-18 DIAGNOSIS — F1010 Alcohol abuse, uncomplicated: Secondary | ICD-10-CM

## 2012-12-18 DIAGNOSIS — F4310 Post-traumatic stress disorder, unspecified: Secondary | ICD-10-CM

## 2012-12-18 DIAGNOSIS — F39 Unspecified mood [affective] disorder: Secondary | ICD-10-CM

## 2012-12-18 MED ORDER — BUPROPION HCL 100 MG PO TABS: 100.0000 mg | ORAL_TABLET | Freq: Every day | ORAL | Status: DC
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
## 2012-12-31 ENCOUNTER — Ambulatory Visit (INDEPENDENT_AMBULATORY_CARE_PROVIDER_SITE_OTHER): Payer: BC Managed Care – PPO | Admitting: Marriage and Family Therapist

## 2012-12-31 DIAGNOSIS — F449 Dissociative and conversion disorder, unspecified: Secondary | ICD-10-CM

## 2012-12-31 DIAGNOSIS — F4310 Post-traumatic stress disorder, unspecified: Secondary | ICD-10-CM

## 2012-12-31 NOTE — Progress Notes (Signed)
   THERAPIST PROGRESS NOTE  Session Time: Noon - 1 p.m.  Participation Level: Active  Behavioral Response: CasualAlertAnxious  Type of Therapy: Individual Therapy  Treatment Goals addressed: Coping  Interventions: Strength-based and Supportive  Summary: Glenn Mendoza is a 32 y.o. male who presents with PTSD and dissociative d/o.  He was referred by Dr. Lolly Mustache.  Patient reports his ex-wife has allowed him to have the children over the past weekend staying at his parents' house and he drove them back to her without supervision.  He reports he recently started stating what his needs have been including reiterating that the settlement agreement states if the couple is in a relationship can be introduced in two or more weeks of the relationship.  Patient also talked about work.  He has been confronted and has four warnings regarding duties patient has forgotten.  He reports his bosses have noticed that although he is good at his job he appears very distracted (his word).  He states he is now on Wellbutrin for ADHD but has not noticed any change as of yet.  He also states he is in the process of completing his bankruptcy hearing. Patient reports his new relationship with his live-in girlfriend has been going very well.  Also, patient reports no symptoms of PTSD and/or dissociation.  He believes the episode of dissociation was a "one-time event" based on his "losing it."    Suicidal/Homicidal: Nowithout intent/plan  Therapist Response:  Patient talked about the idea that his ex-wife may not be concerned about patient's safety with the children since he was allowed to have them overnight.  Discussed patient changing the way he has been addressing his needs with his ex.  Also talked at length about patient's situation with his job.  Discussed since patient's job is at risk he may want to talk to Dr. Lolly Mustache regarding medications and/or how to approach his bosses relating to his condition.  Assessed  patient's diagnosis and he admits not having any dissociative symptoms for about a year.  Will talk to Dr. Lolly Mustache about how to proceed regarding this diagnosis.     Plan: Return again in 3 weeks.  Diagnosis: Axis I: PTSD; Dissociative d/o    Axis II: Deferred    Glenn Tregoning, LMFT, CTS 12/31/2012

## 2013-01-08 ENCOUNTER — Ambulatory Visit (HOSPITAL_COMMUNITY): Payer: Self-pay | Admitting: Psychiatry

## 2013-01-17 ENCOUNTER — Other Ambulatory Visit (HOSPITAL_COMMUNITY): Payer: Self-pay | Admitting: Psychiatry

## 2013-01-18 ENCOUNTER — Other Ambulatory Visit (HOSPITAL_COMMUNITY): Payer: Self-pay | Admitting: Psychiatry

## 2013-01-18 DIAGNOSIS — F4310 Post-traumatic stress disorder, unspecified: Secondary | ICD-10-CM

## 2013-01-18 MED ORDER — BUPROPION HCL 100 MG PO TABS: 100.0000 mg | ORAL_TABLET | Freq: Every day | ORAL | Status: DC

## 2013-01-20 ENCOUNTER — Ambulatory Visit (INDEPENDENT_AMBULATORY_CARE_PROVIDER_SITE_OTHER): Payer: BC Managed Care – PPO | Admitting: Marriage and Family Therapist

## 2013-01-20 DIAGNOSIS — F4310 Post-traumatic stress disorder, unspecified: Secondary | ICD-10-CM

## 2013-01-20 NOTE — Progress Notes (Signed)
   THERAPIST PROGRESS NOTE  Session Time: 11:00 - Noon  Participation Level: Active  Behavioral Response: AlertAnxious (mild)  Type of Therapy: Individual Therapy  Treatment Goals addressed: Coping  Interventions: Strength-based and Supportive  Summary: Glenn Mendoza is a 32 y.o. male who presents with PTSD.  He was referred by Dr. Lolly Mustache.  Patient reports he has been feeling better and less distracted, particularly noticing this change at work.  He states he is also getting feedback from others that he seems more focused.  Patient states he believes it is because the Wellbutrin is working.  Patient also states that he and his ex-wife are getting along better and "finally able to come to an agreement about my having the kids unsupervised."  Patient reports his new relationship is going well and they are going to be renovating her house to make space for his two children.  Patient stated "I believe the past months have been up and down concerning getting my life together but it seems like things are finally starting to work out for me."  He reports telling his ex-wife "I can never guarantee that I will never have another psychotic episode."    Suicidal/Homicidal: Nowithout intent/plan  Therapist Response:  Assessed patient's PTSD symptoms and patient reports having no episodes.  He states believing he is doing very well.   Basically this session was about reinforcing what patient was doing to keep himself health emotionally.  Plan: Return again in 2 weeks.  Diagnosis: Axis I: PTSD    Axis II: Deferred    Glenn Kohlman, LMFT, CTS 01/20/2013

## 2013-01-26 ENCOUNTER — Telehealth (HOSPITAL_COMMUNITY): Payer: Self-pay | Admitting: *Deleted

## 2013-01-26 ENCOUNTER — Ambulatory Visit (INDEPENDENT_AMBULATORY_CARE_PROVIDER_SITE_OTHER): Payer: BC Managed Care – PPO | Admitting: Emergency Medicine

## 2013-01-26 VITALS — BP 142/83 | HR 94 | Temp 98.0°F | Resp 16 | Ht 71.0 in | Wt 274.6 lb

## 2013-01-26 DIAGNOSIS — K219 Gastro-esophageal reflux disease without esophagitis: Secondary | ICD-10-CM | POA: Insufficient documentation

## 2013-01-26 DIAGNOSIS — F329 Major depressive disorder, single episode, unspecified: Secondary | ICD-10-CM | POA: Insufficient documentation

## 2013-01-26 DIAGNOSIS — T887XXA Unspecified adverse effect of drug or medicament, initial encounter: Secondary | ICD-10-CM

## 2013-01-26 DIAGNOSIS — T50905A Adverse effect of unspecified drugs, medicaments and biological substances, initial encounter: Secondary | ICD-10-CM

## 2013-01-26 DIAGNOSIS — R21 Rash and other nonspecific skin eruption: Secondary | ICD-10-CM

## 2013-01-26 LAB — POCT URINALYSIS DIPSTICK
Bilirubin, UA: NEGATIVE
Blood, UA: NEGATIVE
Glucose, UA: NEGATIVE
Nitrite, UA: NEGATIVE
pH, UA: 6.5

## 2013-01-26 LAB — POCT CBC
HCT, POC: 51.7 % (ref 43.5–53.7)
Hemoglobin: 17.1 g/dL (ref 14.1–18.1)
Lymph, poc: 2.8 (ref 0.6–3.4)
MCH, POC: 31.3 pg — AB (ref 27–31.2)
MCHC: 33.1 g/dL (ref 31.8–35.4)
MCV: 94.6 fL (ref 80–97)
RBC: 5.46 M/uL (ref 4.69–6.13)
WBC: 8.2 10*3/uL (ref 4.6–10.2)

## 2013-01-26 MED ORDER — CETIRIZINE HCL 10 MG PO TABS: 10.0000 mg | ORAL_TABLET | Freq: Every day | ORAL | Status: DC

## 2013-01-26 MED ORDER — RANITIDINE HCL 300 MG PO TABS: 300.0000 mg | ORAL_TABLET | Freq: Every day | ORAL | Status: DC

## 2013-01-26 NOTE — Telephone Encounter (Signed)
Was started on Wellbutrin at last appointment. Developed allergic type rash across hands, feet and legs. Went to Urgent Care. They told him they had seen this before on people just starting Wellbutrin. He has discontinued the medicine on the recommendation of the MD at Urgent Care. Patient wants to discuss alternative medication when he comes for next appt 6/25.

## 2013-01-26 NOTE — Progress Notes (Signed)
[address], [place] [address]   Phone [phone]  Subjective:    Patient ID: Glenn Mendoza, male    DOB: 05/27/1981, 32 y.o.   MRN: [id number]  HPI Pt presents to clinic with 4 day h/o rash on hands and legs.  He had poison ivy about 2 weeks ago from cutting grass on his ankles but it is almost gone.  Then on Thursday night he was working with finished untreated wood and the next morning he started with this rash - the rash has gotten worse - it is very itchy and his ankles and hands are swollen and feel tight.  He has no SOB or tongue swelling.  He does not feel bad and has not had a tick bite.  He has been tapering off his Paxil and was started on the Wellbutrin about 1 month ago.  He has been using no meds to help with the rash or the itching.   Review of Systems  Constitutional: Negative for fever, chills and fatigue.  Respiratory: Negative for cough and shortness of breath.   Skin: Positive for rash.       Objective:   Physical Exam  Vitals reviewed. Constitutional: He appears well-developed and well-nourished.  HENT:  Head: Normocephalic and atraumatic.  Right Ear: External ear normal.  Left Ear: External ear normal.  Pulmonary/Chest: Effort normal.  Skin: Skin is warm and dry. Rash noted. Rash is urticarial (non-blanching rash on ankles and hands including palms and soles). Rash is not vesicular.  Non-blanching vasculitic rash on back of calves.    Psychiatric: He has a normal mood and affect. His behavior is normal. Judgment and thought content normal.   Results for orders placed in visit on 01/26/13  POCT CBC      Result Value Range   WBC 8.2  4.6 - 10.2 K/uL   Lymph, poc 2.8  0.6 - 3.4   POC LYMPH PERCENT 34.5  10 - 50 %L   MID (cbc) 0.6  0 - 0.9   POC MID % 7.1  0 - 12 %M   POC Granulocyte 4.8  2 - 6.9   Granulocyte percent 58.4  37 - 80 %G   RBC 5.46  4.69 - 6.13 M/uL   Hemoglobin 17.1  14.1 - 18.1 g/dL   HCT, POC 84.1  32.4 - 53.7 %   MCV 94.6  80 - 97  fL   MCH, POC 31.3 (*) 27 - 31.2 pg   MCHC 33.1  31.8 - 35.4 g/dL   RDW, POC 40.1     Platelet Count, POC 226  142 - 424 K/uL   MPV 9.5  0 - 99.8 fL  POCT URINALYSIS DIPSTICK      Result Value Range   Color, UA amber     Clarity, UA cloudy     Glucose, UA negative     Bilirubin, UA neg     Ketones, UA trace     Spec Grav, UA 1.025     Blood, UA neg     pH, UA 6.5     Protein, UA neg     Urobilinogen, UA 0.2     Nitrite, UA neg     Leukocytes, UA Negative          Assessment & Plan:  Rash and nonspecific skin eruption this is concerning for an allergic reaction to wellbutrin - pt to push fluids to help flush it out of his system - he has not  taken the meds today and he will not take it in the future- Plan: POCT CBC, POCT urinalysis dipstick, cetirizine (ZYRTEC) 10 MG tablet, ranitidine (ZANTAC) 300 MG tablet  Drug reaction, initial encounter - Plan: cetirizine (ZYRTEC) 10 MG tablet, ranitidine (ZANTAC) 300 MG tablet  Pt to call his psychiatrist about the probably med reaction and for them to determine the next step in his care.  Pt was seen with Dr Cleta Alberts.  Benny Lennert PA-C 01/26/2013 1:38 PM

## 2013-02-04 ENCOUNTER — Ambulatory Visit (INDEPENDENT_AMBULATORY_CARE_PROVIDER_SITE_OTHER): Payer: BC Managed Care – PPO | Admitting: Marriage and Family Therapist

## 2013-02-04 DIAGNOSIS — F4310 Post-traumatic stress disorder, unspecified: Secondary | ICD-10-CM

## 2013-02-04 NOTE — Progress Notes (Signed)
   THERAPIST PROGRESS NOTE  Session Time: 1:00 - 2:00 p.m.  Participation Level: Active  Behavioral Response: CasualAlertAnxious and Depressed (mild)  Type of Therapy: Individual Therapy  Treatment Goals addressed: Coping  Interventions: Strength-based and Supportive  Summary: Glenn Mendoza is a 32 y.o. male who presents with PTSD.  He was referred by Dr. Lolly Mustache.  Patient reports he continues to feel some depression and anxiety.  He reports some of it is due to interactions between patient and his ex-wife over getting overnight visits with the children.  Patient reports he was able to have the children meet his new girlfriend's children and that his children seemed to adjust "very well."  Patient also stated he has has no symptoms of PTSD for some time although admits he had PTSD in the past "a lot longer than I knew."  Patient reports an allergic reaction to Wellbutrin i.e., itching and swelling of the feet and hands.  He went to an urgent care where he was diagnosed and told to stop the usage.  He does not remember if he called this office to report same.  Patient states that things at work are now back to normal relating to concerns that he was having difficulty concentrating.  Suicidal/Homicidal: Nowithout intent/plan  Therapist Response:  Assessed patient's symptoms and it has been at least six months since patient has exhibited any symptoms of PTSD based on self-report, assessments, and observations.  He still admits to depression and anxiety much of which he believes is situational.  Encouraged patient to be more assertive with his ex-wife on establishing boundaries around what she expects where the children are concerned and his expectations.  Encouraged patient to continue doing outside activities in particular acting to help patient enjoy something of his own.  Plan: Return again in 2 weeks.  Diagnosis: Axis I: Post Traumatic Stress Disorder    Axis II:  Deferred    Savahna Casados, LMFT, CTS  02/04/2013

## 2013-02-11 ENCOUNTER — Encounter (HOSPITAL_COMMUNITY): Payer: Self-pay | Admitting: Psychiatry

## 2013-02-11 ENCOUNTER — Ambulatory Visit (INDEPENDENT_AMBULATORY_CARE_PROVIDER_SITE_OTHER): Payer: BC Managed Care – PPO | Admitting: Psychiatry

## 2013-02-11 VITALS — BP 110/80 | HR 95 | Ht 72.0 in | Wt 281.0 lb

## 2013-02-11 DIAGNOSIS — F39 Unspecified mood [affective] disorder: Secondary | ICD-10-CM

## 2013-02-11 DIAGNOSIS — F988 Other specified behavioral and emotional disorders with onset usually occurring in childhood and adolescence: Secondary | ICD-10-CM

## 2013-02-11 DIAGNOSIS — F1010 Alcohol abuse, uncomplicated: Secondary | ICD-10-CM

## 2013-02-11 DIAGNOSIS — F4310 Post-traumatic stress disorder, unspecified: Secondary | ICD-10-CM

## 2013-02-11 MED ORDER — METHYLPHENIDATE HCL 5 MG PO TABS: 5.0000 mg | ORAL_TABLET | Freq: Every day | ORAL | Status: DC
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
## 2013-02-18 ENCOUNTER — Ambulatory Visit (INDEPENDENT_AMBULATORY_CARE_PROVIDER_SITE_OTHER): Payer: BC Managed Care – PPO | Admitting: Marriage and Family Therapist

## 2013-02-18 DIAGNOSIS — F411 Generalized anxiety disorder: Secondary | ICD-10-CM

## 2013-02-18 DIAGNOSIS — F329 Major depressive disorder, single episode, unspecified: Secondary | ICD-10-CM

## 2013-02-18 DIAGNOSIS — F4310 Post-traumatic stress disorder, unspecified: Secondary | ICD-10-CM

## 2013-02-18 NOTE — Progress Notes (Signed)
   THERAPIST PROGRESS NOTE  Session Time:  3:00 - 4:00 p.m.  Participation Level: Active  Behavioral Response: CasualAlertAnxious (mild)  Type of Therapy: Individual Therapy  Treatment Goals addressed: Coping  Interventions: Strength-based and Supportive  Summary: Glenn Mendoza is a 32 y.o. male who presents with depression and anxiety.  He was referred by Dr. Lolly Mustache.  Patient reports he is doing well.  He states he is happy, at times having some depression and anxiety.  Patient reports he and his girlfriend have been working on getting their house ready for patient's children to stay.  He has completed the bathroom.  He reports his ex is letting him have the children next weekend overnight where he is living.  He states he confronted his ex, one of the first times he has confronted her.  He also states work has calmed down relating to patient's inattentiveness.  Patient states he is now on 5 mg of Ritalin.  He says he cannot tell if it is working but he does feel less distracted.  He is now of 20 mg of Paxel versus 40 mg.  He stopped taking the Wellbutrin due to side effects.  He saw Dr. Lolly Mustache last week relating to same.  Suicidal/Homicidal: NA  Therapist Response:  Discussed patient's diagnosis with him relating to improvement in symptoms.  Will be diagnosing with Depression NOS and GAD.  Will discuss with doctor patient's diagnosis of PTSD.  Commended patient for being assertive with his ex.  Will discuss with patient slowing down his session and/or making a plan to slow down his sessions if he continues to improve.  Plan: Return again in 2 weeks.  Diagnosis: Axis I: Depression NOS; GAD; PTSD    Axis II: Deferred    Wymon Swaney, LMFT, CTS 02/18/2013

## 2013-03-04 ENCOUNTER — Ambulatory Visit (INDEPENDENT_AMBULATORY_CARE_PROVIDER_SITE_OTHER): Payer: BC Managed Care – PPO | Admitting: Marriage and Family Therapist

## 2013-03-04 DIAGNOSIS — F39 Unspecified mood [affective] disorder: Secondary | ICD-10-CM

## 2013-03-04 NOTE — Progress Notes (Signed)
   THERAPIST PROGRESS NOTE  Session Time:  3:00 - 4:00 p.m.  Participation Level: Active  Behavioral Response: CasualAlert  Type of Therapy: Individual Therapy  Treatment Goals addressed: Coping  Interventions: Strength-based and Supportive  Summary: [name] is a 32 y.o. male who presents with depression and PTSD in remission.  He was referred by Dr. Lolly Mustache.  Patient reports this is the happiest he has been in a long time.  He reports for the first time he will be able to have his children overnight this weekend in his new home with his girlfriend.  He reports he and his ex have been able to come up with a plan for him to have them overnight.  Patient states he and his girlfriend have been working to complete the third floor to have bedrooms and a bathroom for his children.  He also reports he and his girlfriend are "very much alike" and they tell each other, "can you believe how we lucked out finding each other?"  He reports going to social events like attending a trivia night at a [employer] with his girlfriends and her friends.  He also reports things at work have been going well and that he reported not episodes having to do with patient having a problem focusing.    Suicidal/Homicidal: NA - No necessity to assess for SI/HI at this time.  Therapist Response:  Assessed for PTSD and again patient reports no symptoms.  Even so will continue to assess for same.  Patient is having some depression but mainly about the situation with his children at times.  He admits thinking he was an introvert, "not liking being around others," but now that he is out of his marriage admits he forgot that he does tend to like being around other people.  Discussed patient trying to go down to monthly.  Discussed his putting a plan in place to make sure he is using his support during this time.  Patient agreed he would talk to his girlfriend but admits the first support he goes to is his family,  particularly his sister.  Patient also agreed in the next month he will contact this writer around the first week in August to "check in."  Note:  This Clinical research associate discussed this plan with Dr. Celene Kras after the session.  Plan: Return again in one month.  Diagnosis: Axis I: Mood d/o NOS; PTSD    Axis II: Deferred    Tyasia Packard, LMFT, CTSD 03/04/2013

## 2013-03-16 ENCOUNTER — Ambulatory Visit (HOSPITAL_COMMUNITY): Payer: Self-pay | Admitting: Psychiatry

## 2013-03-18 ENCOUNTER — Ambulatory Visit (HOSPITAL_COMMUNITY): Payer: Self-pay | Admitting: Marriage and Family Therapist

## 2013-03-25 ENCOUNTER — Ambulatory Visit (INDEPENDENT_AMBULATORY_CARE_PROVIDER_SITE_OTHER): Payer: BC Managed Care – PPO | Admitting: Psychiatry

## 2013-03-25 ENCOUNTER — Encounter (HOSPITAL_COMMUNITY): Payer: Self-pay | Admitting: Psychiatry

## 2013-03-25 VITALS — BP 128/71 | HR 92 | Ht 72.0 in | Wt 285.0 lb

## 2013-03-25 DIAGNOSIS — F39 Unspecified mood [affective] disorder: Secondary | ICD-10-CM

## 2013-03-25 DIAGNOSIS — F4310 Post-traumatic stress disorder, unspecified: Secondary | ICD-10-CM

## 2013-03-25 DIAGNOSIS — F988 Other specified behavioral and emotional disorders with onset usually occurring in childhood and adolescence: Secondary | ICD-10-CM

## 2013-03-25 DIAGNOSIS — F1010 Alcohol abuse, uncomplicated: Secondary | ICD-10-CM

## 2013-03-25 MED ORDER — LISDEXAMFETAMINE DIMESYLATE 20 MG PO CAPS: 20.0000 mg | ORAL_CAPSULE | ORAL | Status: DC

## 2013-03-25 MED ORDER — PAROXETINE HCL 20 MG PO TABS: 20.0000 mg | ORAL_TABLET | ORAL | Status: DC
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
## 2013-04-01 ENCOUNTER — Ambulatory Visit (INDEPENDENT_AMBULATORY_CARE_PROVIDER_SITE_OTHER): Payer: BC Managed Care – PPO | Admitting: Marriage and Family Therapist

## 2013-04-01 DIAGNOSIS — F329 Major depressive disorder, single episode, unspecified: Secondary | ICD-10-CM

## 2013-04-01 DIAGNOSIS — F3289 Other specified depressive episodes: Secondary | ICD-10-CM

## 2013-04-01 NOTE — Progress Notes (Signed)
   THERAPIST PROGRESS NOTE  Session Time:  3:00 - 4:00 p.m.  Participation Level: Active  Behavioral Response: CasualAlertDepressed (mild)  Type of Therapy: Individual Therapy  Treatment Goals addressed: Coping  Interventions: Strength-based and Supportive  Summary: Glenn Mendoza is a 32 y.o. male who presents with depression.  He was referred by Dr. Lolly Mustache.  Patient reports things are going well for him relating to his personal life.  He reports being with his new girlfriend has been very good for him and things with his ex have been stable.  He did however state that he is having difficulties at work.  He was called in for a meeting because he has been making mistakes at work again.  He also reports his boss was ready to fire him over another mistake but after talking to patient decided to give him another chance.  Patient states he is worried about his finances should he be fired and does not know how he will make the kind of money he makes at his job elsewhere.  Suicidal/Homicidal: Nowithout intent/plan  Therapist Response:  Discussed patient's job situation at length to find out why he is has been making mistakes.  Patient was able to identify he is bored at work, and needs to do something with more creativity.  He admits being bored at work has happened in the past.  Through the discussion patient was able to come up with a plan where he will call a few resources for other jobs.  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  Patient stated he felt a lot better about his job situation after this discussion.  Plan: Return again in 1 month.  Diagnosis: Axis I:  Depressive d/o NOS    Axis II: Deferred    Lowella Kindley, LMFT, CTS 04/01/2013

## 2013-04-15 ENCOUNTER — Ambulatory Visit (HOSPITAL_COMMUNITY): Payer: Self-pay | Admitting: Marriage and Family Therapist

## 2013-04-28 ENCOUNTER — Ambulatory Visit (INDEPENDENT_AMBULATORY_CARE_PROVIDER_SITE_OTHER): Payer: BC Managed Care – PPO | Admitting: Psychiatry

## 2013-04-28 ENCOUNTER — Encounter (HOSPITAL_COMMUNITY): Payer: Self-pay | Admitting: Psychiatry

## 2013-04-28 VITALS — BP 118/78 | HR 80 | Ht 72.0 in | Wt 282.0 lb

## 2013-04-28 DIAGNOSIS — F39 Unspecified mood [affective] disorder: Secondary | ICD-10-CM

## 2013-04-28 DIAGNOSIS — F4310 Post-traumatic stress disorder, unspecified: Secondary | ICD-10-CM

## 2013-04-28 DIAGNOSIS — F988 Other specified behavioral and emotional disorders with onset usually occurring in childhood and adolescence: Secondary | ICD-10-CM

## 2013-04-28 DIAGNOSIS — F1010 Alcohol abuse, uncomplicated: Secondary | ICD-10-CM

## 2013-04-28 MED ORDER — LISDEXAMFETAMINE DIMESYLATE 20 MG PO CAPS
20.0000 mg | ORAL_CAPSULE | ORAL | Status: DC
Start: 2013-04-28 — End: 2013-06-29

## 2013-04-28 MED ORDER — PAROXETINE HCL 20 MG PO TABS: 20.0000 mg | ORAL_TABLET | ORAL | Status: DC

## 2013-04-28 MED ORDER — LISDEXAMFETAMINE DIMESYLATE 20 MG PO CAPS: 20.0000 mg | ORAL_CAPSULE | ORAL | Status: DC
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
## 2013-04-29 ENCOUNTER — Ambulatory Visit (INDEPENDENT_AMBULATORY_CARE_PROVIDER_SITE_OTHER): Payer: BC Managed Care – PPO | Admitting: Marriage and Family Therapist

## 2013-04-29 DIAGNOSIS — F329 Major depressive disorder, single episode, unspecified: Secondary | ICD-10-CM

## 2013-04-29 NOTE — Progress Notes (Signed)
   THERAPIST PROGRESS NOTE  Session Time:  3:00 - 4:00 p.m.  Participation Level: Active  Behavioral Response: CasualAlertNo depression  Type of Therapy: Individual Therapy  Treatment Goals addressed: Coping  Interventions: Strength-based and Supportive  Summary: Glenn Mendoza is a 32 y.o. male who presents with depression.  He was referred by Dr. Lolly Mustache.  Patient reports he is doing very well.  He reports looking back over the past month and with the exception of having financial worries he has not been depressed.  Patient states he was not even depressed about money, more "what am I going to do?"  He admits he responded by applying for several jobs and thinking of various ways to make money.  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  He talked about how his job is going and he did talk to someone who owns a [employer] company about working there but the man told him he had no jobs at this time but would keep patient in mind.  He reports his relationship with his girlfriend remains excellent and he filed for divorce.  He reports there has been some conflict with his wife but he is being assertive with her.  Patient states he is "tired of having negativity in my life."  Suicidal/Homicidal: NA  Therapist Response:  Assessed patient's symptoms and based on the information, how he is handling his life, and body language he appears to not be depressed and stated he was "happy."  Talked about where to go from here regarding therapy.  Patient will think about whether or not he wants to stop therapy after his next session or continue to come in for monthly maintenance.  Plan: Return again in 1 month.  Diagnosis: Axis I: Depressive d/o, NOS    Axis II: Deferred    Tiffnay Bossi, LMFT, CTS 04/29/2013

## 2013-05-13 ENCOUNTER — Ambulatory Visit (HOSPITAL_COMMUNITY): Payer: Self-pay | Admitting: Marriage and Family Therapist

## 2013-06-16 ENCOUNTER — Encounter (INDEPENDENT_AMBULATORY_CARE_PROVIDER_SITE_OTHER): Payer: Self-pay

## 2013-06-16 ENCOUNTER — Ambulatory Visit (INDEPENDENT_AMBULATORY_CARE_PROVIDER_SITE_OTHER): Payer: BC Managed Care – PPO | Admitting: Marriage and Family Therapist

## 2013-06-16 DIAGNOSIS — F329 Major depressive disorder, single episode, unspecified: Secondary | ICD-10-CM

## 2013-06-17 NOTE — Progress Notes (Signed)
   THERAPIST PROGRESS NOTE  Session Time:  3:00 - 4:00 p.m.  Participation Level: Active  Behavioral Response: CasualAlertNo symptoms at time of session.  Type of Therapy: Individual Therapy  Treatment Goals addressed: Coping/Termination  Interventions: Strength-based and Supportive  Summary: Glenn Mendoza is a 32 y.o. male who presents with depression.  He was referred by Dr. Lolly Mustache.  Patient reports he is doing well.  He reports his life is good, particularly his home life.  He reports his girlfriend is supportive and the children have been getting along, that his children are adjusting to the new life, particularly his four-year-old daughter.  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  Patient states after the first of the year he is going to talk to his boss and hopefully he will be able to receive additional sums promised to him with this change.  He reports however having money problems but states his girlfriend is very supportive in this way.  He reports things are going well with his ex and that they are coparenting well.    Suicidal/Homicidal: N/A at time of the session  Therapist Response:  Assessed any symptoms of PTSD, dissociative, and depression.  Patient has been able to stay stable for more than three months.  Discussed patient's treatment plan as a review for termination.  Patient states initially his goal for therapy was "to not jump off of a bridge."  Patient states he is "lightyears from that thought" and cannot imagine he was thinking in this way.  Discussed patient meeting all of his treatment goals.  Discussed what termination means in relationship that if he has a problem of any kind he can always make another appointment.   Discussed the process patient has made in therapy in such a short period of time considering his background.  Commended him for working hard at getting better.     Plan: Return again in PRN.  Diagnosis: Axis I: Depressive Disorder NOS    Axis II: Deferred    Furman Trentman, LMFT, CTS 06/17/2013

## 2013-06-29 ENCOUNTER — Encounter (HOSPITAL_COMMUNITY): Payer: Self-pay | Admitting: Psychiatry

## 2013-06-29 ENCOUNTER — Ambulatory Visit (INDEPENDENT_AMBULATORY_CARE_PROVIDER_SITE_OTHER): Payer: BC Managed Care – PPO | Admitting: Psychiatry

## 2013-06-29 VITALS — Wt 283.0 lb

## 2013-06-29 DIAGNOSIS — F4310 Post-traumatic stress disorder, unspecified: Secondary | ICD-10-CM

## 2013-06-29 DIAGNOSIS — F39 Unspecified mood [affective] disorder: Secondary | ICD-10-CM

## 2013-06-29 DIAGNOSIS — F1010 Alcohol abuse, uncomplicated: Secondary | ICD-10-CM

## 2013-06-29 MED ORDER — PAROXETINE HCL 20 MG PO TABS: 20.0000 mg | ORAL_TABLET | ORAL | Status: DC
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
## 2013-09-29 ENCOUNTER — Encounter (HOSPITAL_COMMUNITY): Payer: Self-pay | Admitting: Psychiatry

## 2013-09-29 ENCOUNTER — Ambulatory Visit (INDEPENDENT_AMBULATORY_CARE_PROVIDER_SITE_OTHER): Payer: BC Managed Care – PPO | Admitting: Psychiatry

## 2013-09-29 VITALS — BP 124/86 | HR 96 | Ht 71.0 in | Wt 282.0 lb

## 2013-09-29 DIAGNOSIS — F4310 Post-traumatic stress disorder, unspecified: Secondary | ICD-10-CM

## 2013-09-29 DIAGNOSIS — F39 Unspecified mood [affective] disorder: Secondary | ICD-10-CM

## 2013-09-29 DIAGNOSIS — F1010 Alcohol abuse, uncomplicated: Secondary | ICD-10-CM
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
## 2013-12-28 ENCOUNTER — Ambulatory Visit (HOSPITAL_COMMUNITY): Payer: Self-pay | Admitting: Psychiatry

## 2014-10-19 ENCOUNTER — Ambulatory Visit (INDEPENDENT_AMBULATORY_CARE_PROVIDER_SITE_OTHER): Payer: BLUE CROSS/BLUE SHIELD | Admitting: Family Medicine

## 2014-10-19 VITALS — BP 130/74 | HR 85 | Temp 98.2°F | Resp 15 | Ht 72.0 in | Wt 284.8 lb

## 2014-10-19 DIAGNOSIS — M542 Cervicalgia: Secondary | ICD-10-CM | POA: Diagnosis not present

## 2014-10-19 DIAGNOSIS — L0390 Cellulitis, unspecified: Secondary | ICD-10-CM | POA: Diagnosis not present

## 2014-10-19 DIAGNOSIS — L0291 Cutaneous abscess, unspecified: Secondary | ICD-10-CM | POA: Diagnosis not present

## 2014-10-19 MED ORDER — CEFTRIAXONE SODIUM 1 G IJ SOLR
1.0000 g | Freq: Once | INTRAMUSCULAR | Status: AC
  Administered 2014-10-19: 1 g via INTRAMUSCULAR

## 2014-10-19 MED ORDER — TRAMADOL HCL 50 MG PO TABS
50.0000 mg | ORAL_TABLET | Freq: Three times a day (TID) | ORAL | Status: DC | PRN
Start: 2014-10-19 — End: 2014-11-15

## 2014-10-19 MED ORDER — DOXYCYCLINE HYCLATE 100 MG PO TABS: 100.0000 mg | ORAL_TABLET | Freq: Two times a day (BID) | ORAL | Status: DC

## 2014-10-19 NOTE — Progress Notes (Signed)
 Chief Complaint:  Chief Complaint  Patient presents with  . Cyst    Located on top of back near neck, x 3days    HPI: Glenn Mendoza is a 34 y.o. male who is here for neck rash , swelling, 6/10 pain x 2-3  days , he tried popping it but nothing really came out. He has had hd no fevers or chills. Son has a hx of MRSA and gets skin infections frequent, he is not diabetic, he is not sure if he is a colonizer for MRSA. No fevers or chills, nausea or vomiting. Has taken ibuprofen without relief. He tried standing underneath hot water during shower and that made it feel better  Past Medical History  Diagnosis Date  . Acid reflux   . Depression   . PTSD (post-traumatic stress disorder)   . Allergy   . Anxiety    History reviewed. No pertinent past surgical history. History   Social History  . Marital Status: Married    Spouse Name: N/A  . Number of Children: N/A  . Years of Education: N/A   Social History Main Topics  . Smoking status: Current Some Day Smoker -- 0.50 packs/day for .2 years    Types: Cigarettes  . Smokeless tobacco: Not on file  . Alcohol Use: No  . Drug Use: Not on file  . Sexual Activity: Not on file   Other Topics Concern  . None   Social History Narrative   Family History  Problem Relation Age of Onset  . Depression Mother   . Diabetes Mother   . Alcohol abuse Father   . Hypertension Father   . Alcohol abuse Sister   . Alcohol abuse Brother    Allergies  Allergen Reactions  . Wellbutrin [Bupropion] Hives    On palms and soles  . Codeine Itching and Anxiety   Prior to Admission medications   Medication Sig Start Date End Date Taking? Authorizing Provider  omeprazole (PRILOSEC) 20 MG capsule  12/20/11  Yes Historical Provider, MD  PARoxetine (PAXIL) 20 MG tablet Take 1 tablet (20 mg total) by mouth every morning. Patient not taking: Reported on 10/19/2014 06/29/13   Kathlee Nations, MD  ranitidine (ZANTAC) 300 MG tablet Take 1 tablet (300 mg  total) by mouth at bedtime. Patient not taking: Reported on 10/19/2014 01/26/13   Mancel Bale, PA-C     ROS: The patient denies fevers, chills, night sweats, unintentional weight loss, chest pain, palpitations, wheezing, dyspnea on exertion, nausea, vomiting, abdominal pain, dysuria, hematuria, melena, numbness, weakness, or tingling.  All other systems have been reviewed and were otherwise negative with the exception of those mentioned in the HPI and as above.    PHYSICAL EXAM: Filed Vitals:   10/19/14 0839  BP: 130/74  Pulse: 85  Temp: 98.2 F (36.8 C)  Resp: 15   Filed Vitals:   10/19/14 0839  Height: 6' (1.829 m)  Weight: 284 lb 12.8 oz (129.184 kg)   Body mass index is 38.62 kg/(m^2).  General: Alert, no acute distress HEENT:  Normocephalic, atraumatic, oropharynx patent. EOMI, PERRLA Cardiovascular:  Regular rate and rhythm, no rubs murmurs or gallops.  No Carotid bruits, radial pulse intact. No pedal edema.  Respiratory: Clear to auscultation bilaterally.  No wheezes, rales, or rhonchi.  No cyanosis, no use of accessory musculature GI: No organomegaly, abdomen is soft and non-tender, positive bowel sounds.  No masses. Skin: + 1 by 1.5 inch erythematous cellilitis at  c6-c7 , nonfluctuant. , tender, hard knot Neurologic: Facial musculature symmetric. Psychiatric: Patient is appropriate throughout our interaction. Lymphatic: No cervical lymphadenopathy Musculoskeletal: Gait intact. Full ROM of neck    LABS: Results for orders placed or performed in visit on 01/26/13  POCT CBC  Result Value Ref Range   WBC 8.2 4.6 - 10.2 K/uL   Lymph, poc 2.8 0.6 - 3.4   POC LYMPH PERCENT 34.5 10 - 50 %L   MID (cbc) 0.6 0 - 0.9   POC MID % 7.1 0 - 12 %M   POC Granulocyte 4.8 2 - 6.9   Granulocyte percent 58.4 37 - 80 %G   RBC 5.46 4.69 - 6.13 M/uL   Hemoglobin 17.1 14.1 - 18.1 g/dL   HCT, POC 51.7 43.5 - 53.7 %   MCV 94.6 80 - 97 fL   MCH, POC 31.3 (A) 27 - 31.2 pg   MCHC 33.1  31.8 - 35.4 g/dL   RDW, POC 13.0 %   Platelet Count, POC 226 142 - 424 K/uL   MPV 9.5 0 - 99.8 fL  POCT urinalysis dipstick  Result Value Ref Range   Color, UA amber    Clarity, UA cloudy    Glucose, UA negative    Bilirubin, UA neg    Ketones, UA trace    Spec Grav, UA 1.025    Blood, UA neg    pH, UA 6.5    Protein, UA neg    Urobilinogen, UA 0.2    Nitrite, UA neg    Leukocytes, UA Negative      EKG/XRAY:   Primary read interpreted by Dr. Marin Comment at [facility].   ASSESSMENT/PLAN: Encounter Diagnoses  Name Primary?  . Cellulitis and abscess Yes  . Neck pain    34 year old obese male wihout DM who is here with nonfluctuant back cellulitis and also abscess at C6-T1. Due to location of abscess that can;t be drained at neck at this time will give IM abx and PO abx Rocephin 1 gram x 1 Rx Doxycyline Rx tramadol for pain Warm compressses Fu prn   Gross sideeffects, risk and benefits, and alternatives of medications d/w patient. Patient is aware that all medications have potential sideeffects and we are unable to predict every sideeffect or drug-drug interaction that may occur.  , [place], DO 10/19/2014 9:33 AM

## 2014-10-19 NOTE — Patient Instructions (Signed)
Doxycycline tablets or capsules What is this medicine? DOXYCYCLINE (dox i SYE kleen) is a tetracycline antibiotic. It kills certain bacteria or stops their growth. It is used to treat many kinds of infections, like dental, skin, respiratory, and urinary tract infections. It also treats acne, Lyme disease, malaria, and certain sexually transmitted infections. This medicine may be used for other purposes; ask your health care provider or pharmacist if you have questions. COMMON BRAND NAME(S): Acticlate, Adoxa, Adoxa CK, Adoxa Pak, Adoxa TT, Alodox, Avidoxy, Doxal, Monodox, Morgidox 1x, Morgidox 1x Kit, Morgidox 2x, Morgidox 2x Kit, Ocudox, Vibra-Tabs, Vibramycin What should I tell my health care provider before I take this medicine? They need to know if you have any of these conditions: -liver disease -long exposure to sunlight like working outdoors -stomach problems like colitis -an unusual or allergic reaction to doxycycline, tetracycline antibiotics, other medicines, foods, dyes, or preservatives -pregnant or trying to get pregnant -breast-feeding How should I use this medicine? Take this medicine by mouth with a full glass of water. Follow the directions on the prescription label. It is best to take this medicine without food, but if it upsets your stomach take it with food. Take your medicine at regular intervals. Do not take your medicine more often than directed. Take all of your medicine as directed even if you think you are better. Do not skip doses or stop your medicine early. Talk to your pediatrician regarding the use of this medicine in children. Special care may be needed. While this drug may be prescribed for children as young as 31 years old for selected conditions, precautions do apply. Overdosage: If you think you have taken too much of this medicine contact a poison control center or emergency room at once. NOTE: This medicine is only for you. Do not share this medicine with  others. What if I miss a dose? If you miss a dose, take it as soon as you can. If it is almost time for your next dose, take only that dose. Do not take double or extra doses. What may interact with this medicine? -antacids -barbiturates -birth control pills -bismuth subsalicylate -carbamazepine -methoxyflurane -other antibiotics -phenytoin -vitamins that contain iron -warfarin This list may not describe all possible interactions. Give your health care provider a list of all the medicines, herbs, non-prescription drugs, or dietary supplements you use. Also tell them if you smoke, drink alcohol, or use illegal drugs. Some items may interact with your medicine. What should I watch for while using this medicine? Tell your doctor or health care professional if your symptoms do not improve. Do not treat diarrhea with over the counter products. Contact your doctor if you have diarrhea that lasts more than 2 days or if it is severe and watery. Do not take this medicine just before going to bed. It may not dissolve properly when you lay down and can cause pain in your throat. Drink plenty of fluids while taking this medicine to also help reduce irritation in your throat. This medicine can make you more sensitive to the sun. Keep out of the sun. If you cannot avoid being in the sun, wear protective clothing and use sunscreen. Do not use sun lamps or tanning beds/booths. Birth control pills may not work properly while you are taking this medicine. Talk to your doctor about using an extra method of birth control. If you are being treated for a sexually transmitted infection, avoid sexual contact until you have finished your treatment. Your sexual partner may  also need treatment. Avoid antacids, aluminum, calcium, magnesium, and iron products for 4 hours before and 2 hours after taking a dose of this medicine. If you are using this medicine to prevent malaria, you should still protect yourself from  contact with mosquitos. Stay in screened-in areas, use mosquito nets, keep your body covered, and use an insect repellent. What side effects may I notice from receiving this medicine? Side effects that you should report to your doctor or health care professional as soon as possible: -allergic reactions like skin rash, itching or hives, swelling of the face, lips, or tongue -difficulty breathing -fever -itching in the rectal or genital area -pain on swallowing -redness, blistering, peeling or loosening of the skin, including inside the mouth -severe stomach pain or cramps -unusual bleeding or bruising -unusually weak or tired -yellowing of the eyes or skin Side effects that usually do not require medical attention (report to your doctor or health care professional if they continue or are bothersome): -diarrhea -loss of appetite -nausea, vomiting This list may not describe all possible side effects. Call your doctor for medical advice about side effects. You may report side effects to FDA at 1-800-FDA-1088. Where should I keep my medicine? Keep out of the reach of children. Store at room temperature, below 30 degrees C (86 degrees F). Protect from light. Keep container tightly closed. Throw away any unused medicine after the expiration date. Taking this medicine after the expiration date can make you seriously ill. NOTE: This sheet is a summary. It may not cover all possible information. If you have questions about this medicine, talk to your doctor, pharmacist, or health care provider.  2015, Elsevier/Gold Standard. (2013-06-12 13:58:06)   Abscess An abscess is an infected area that contains a collection of pus and debris.It can occur in almost any part of the body. An abscess is also known as a furuncle or boil. CAUSES  An abscess occurs when tissue gets infected. This can occur from blockage of oil or sweat glands, infection of hair follicles, or a minor injury to the skin. As the body  tries to fight the infection, pus collects in the area and creates pressure under the skin. This pressure causes pain. People with weakened immune systems have difficulty fighting infections and get certain abscesses more often.  SYMPTOMS Usually an abscess develops on the skin and becomes a painful mass that is red, warm, and tender. If the abscess forms under the skin, you may feel a moveable soft area under the skin. Some abscesses break open (rupture) on their own, but most will continue to get worse without care. The infection can spread deeper into the body and eventually into the bloodstream, causing you to feel ill.  DIAGNOSIS  Your caregiver will take your medical history and perform a physical exam. A sample of fluid may also be taken from the abscess to determine what is causing your infection. TREATMENT  Your caregiver may prescribe antibiotic medicines to fight the infection. However, taking antibiotics alone usually does not cure an abscess. Your caregiver may need to make a small cut (incision) in the abscess to drain the pus. In some cases, gauze is packed into the abscess to reduce pain and to continue draining the area. HOME CARE INSTRUCTIONS   Only take over-the-counter or prescription medicines for pain, discomfort, or fever as directed by your caregiver.  If you were prescribed antibiotics, take them as directed. Finish them even if you start to feel better.  If gauze is  used, follow your caregiver's directions for changing the gauze.  To avoid spreading the infection:  Keep your draining abscess covered with a bandage.  Wash your hands well.  Do not share personal care items, towels, or whirlpools with others.  Avoid skin contact with others.  Keep your skin and clothes clean around the abscess.  Keep all follow-up appointments as directed by your caregiver. SEEK MEDICAL CARE IF:   You have increased pain, swelling, redness, fluid drainage, or bleeding.  You have  muscle aches, chills, or a general ill feeling.  You have a fever. MAKE SURE YOU:   Understand these instructions.  Will watch your condition.  Will get help right away if you are not doing well or get worse. Document Released: 05/16/2005 Document Revised: 02/05/2012 Document Reviewed: 10/19/2011 [facility] Patient Information 2015 [place], [place]. This information is not intended to replace advice given to you by your health care provider. Make sure you discuss any questions you have with your health care provider.

## 2014-11-11 ENCOUNTER — Telehealth: Payer: Self-pay | Admitting: *Deleted

## 2014-11-11 NOTE — Telephone Encounter (Signed)
Unable to reach patient at time of Pre-Visit Call.  Left message for patient to return call when available.    

## 2014-11-15 ENCOUNTER — Encounter: Payer: Self-pay | Admitting: Medical

## 2014-11-15 ENCOUNTER — Ambulatory Visit (INDEPENDENT_AMBULATORY_CARE_PROVIDER_SITE_OTHER): Payer: BLUE CROSS/BLUE SHIELD | Admitting: Medical

## 2014-11-15 VITALS — HR 75 | Temp 98.5°F | Ht 71.0 in | Wt 283.2 lb

## 2014-11-15 DIAGNOSIS — K219 Gastro-esophageal reflux disease without esophagitis: Secondary | ICD-10-CM | POA: Diagnosis not present

## 2014-11-15 DIAGNOSIS — M545 Low back pain, unspecified: Secondary | ICD-10-CM

## 2014-11-15 DIAGNOSIS — J309 Allergic rhinitis, unspecified: Secondary | ICD-10-CM | POA: Insufficient documentation

## 2014-11-15 DIAGNOSIS — J301 Allergic rhinitis due to pollen: Secondary | ICD-10-CM

## 2014-11-15 DIAGNOSIS — F411 Generalized anxiety disorder: Secondary | ICD-10-CM | POA: Insufficient documentation

## 2014-11-15 DIAGNOSIS — F32A Depression, unspecified: Secondary | ICD-10-CM

## 2014-11-15 DIAGNOSIS — M549 Dorsalgia, unspecified: Secondary | ICD-10-CM | POA: Insufficient documentation

## 2014-11-15 DIAGNOSIS — F329 Major depressive disorder, single episode, unspecified: Secondary | ICD-10-CM

## 2014-11-15 MED ORDER — DICLOFENAC SODIUM 75 MG PO TBEC: 75.0000 mg | DELAYED_RELEASE_TABLET | Freq: Two times a day (BID) | ORAL | Status: DC

## 2014-11-15 NOTE — Assessment & Plan Note (Signed)
For your Jerrye Bushy continue prilosec otc. You may need EGD. I want you to try strict diet measures.

## 2014-11-15 NOTE — Assessment & Plan Note (Signed)
Anxiety- Pt had some anxiety in the past. He went to psychiatrist in the past near [place]. PTSD from childhood. He was medication for a while. Then he tapered off medication. He has done well. 1 yr no treatment and doing well.

## 2014-11-15 NOTE — Progress Notes (Signed)
Pre visit review using our clinic review tool, if applicable. No additional management support is needed unless otherwise documented below in the visit note. 

## 2014-11-15 NOTE — Assessment & Plan Note (Signed)
Allergic rhintis- seasonal allergies. This year mild and usual in spring. Occasional in fall

## 2014-11-15 NOTE — Assessment & Plan Note (Signed)
For your chronic intermittent back pain, I would recommend getting xrays. Also would recommend back stretches and some weight loss as well as core strengthening exercises. Rx of diclofenac for occasional pain.

## 2014-11-15 NOTE — Progress Notes (Signed)
Subjective:    Patient ID: Glenn Mendoza, male    DOB: March 22, 1981, 34 y.o.   MRN: [id number]  HPI  I have reviewed pt PMH, PSH, FH, Social History and Surgical History.  Back pain- pt states on and off pain for 6-7 years in lumbar region. Pt has seen chiropracter in past and PT in the past. PT was more expensive about 5 years ago. So he went to chiropracter. They did give him some stretching exercises. When he does exercises the back pain is improved. Pt states pain usually 3-5 days and self limited type pain. Will use ibuprofen otc.  Pt on Saturday did work around his house. So some back pain moderate to severe on Sunday and Monday. Back very tight those days. Pain presently now level 3-4 pain. No pain shooting to legs, no radicular pain. No weakness Pain occurs about 1 time a month.  Allergic rhintis- seasonal allergies. This year mild and usual in spring. Occasional in fall.   Anxiety- Pt had some anxiety in the past. He went to psychiatrist in the past near [place]. PTSD from childhood. He was medication for a while. Then he tapered off medication. He has done well. 1 yr no treatment and doing well.  Depression- hx of with anxiety but doing well for one year.  Gerd- Pt had some since 34 yo. Pt takes omperazole otc. Early on ranitidine. Overall on med since 2008. Pt never had egd done.    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    Review of Systems  Constitutional: Negative for fever, chills, diaphoresis, activity change and fatigue.  HENT: Negative for congestion, drooling, ear pain, facial swelling, nosebleeds, postnasal drip, rhinorrhea, sinus pressure and sore throat.   Respiratory: Negative for cough, chest tightness and shortness of breath.   Cardiovascular: Negative for chest pain, palpitations and leg swelling.       Occasional gerd.  Gastrointestinal: Positive  for abdominal pain. Negative for nausea and vomiting.  Musculoskeletal: Positive for back pain. Negative for neck pain and neck stiffness.  Neurological: Negative for dizziness, tremors, seizures, syncope, facial asymmetry, speech difficulty, weakness, light-headedness, numbness and headaches.  Psychiatric/Behavioral: Negative for behavioral problems, confusion and agitation. The patient is not nervous/anxious.        Controlled now.   Past Medical History  Diagnosis Date  . Acid reflux   . Depression   . PTSD (post-traumatic stress disorder)   . Allergy   . Anxiety     History   Social History  . Marital Status: Married    Spouse Name: N/A  . Number of Children: N/A  . Years of Education: N/A   Occupational History  . Not on file.   Social History Main Topics  . Smoking status: Current Some Day Smoker -- 0.50 packs/day for .2 years    Types: Cigarettes  . Smokeless tobacco: Not on file  . Alcohol Use: No  . Drug Use: Not on file  . Sexual Activity: Yes   Other Topics Concern  . Not on file   Social History Narrative    No past surgical history on file.  Family History  Problem Relation Age of Onset  . Depression Mother   . Diabetes Mother   . Alcohol abuse Father   . Hypertension Father   . Alcohol abuse Sister   . Alcohol abuse Brother  Allergies  Allergen Reactions  . Wellbutrin [Bupropion] Hives    On palms and soles  . Codeine Itching and Anxiety    Current Outpatient Prescriptions on File Prior to Visit  Medication Sig Dispense Refill  . omeprazole (PRILOSEC) 20 MG capsule      No current facility-administered medications on file prior to visit.    Pulse 75  Temp(Src) 98.5 F (36.9 C) (Oral)  Ht 5\' 11"  (1.803 m)  Wt 283 lb 4 oz (128.481 kg)  BMI 39.52 kg/m2  SpO2 98%      Objective:   Physical Exam  General Appearance- Not in acute distress.    Chest and Lung Exam Auscultation: Breath sounds:-Normal. Clear even and  unlabored. Adventitious sounds:- No Adventitious sounds.  Cardiovascular Auscultation:Rythm - Regular, rate and rythm. Heart Sounds -Normal heart sounds.  Abdomen Inspection:-Inspection Normal.  Palpation/Perucssion: Palpation and Percussion of the abdomen reveal- Non Tender, No Rebound tenderness, No rigidity(Guarding) and No Palpable abdominal masses.  Liver:-Normal.  Spleen:- Normal.   Back Mid lumbar spine tenderness to palpation. Pain on straight leg lift. Pain on lateral movements and flexion/extension of the spine.  Lower ext neurologic  L5-S1 sensation intact bilaterally. Normal patellar reflexes bilaterally. No foot drop bilaterally.      Assessment & Plan:

## 2014-11-15 NOTE — Patient Instructions (Addendum)
For your chronic intermittent back pain, I would recommend getting xrays. Also would recommend back stretches and some weight loss as well as core strengthening exercises. Rx of diclofenac for occasional pain.   For your Jerrye Bushy continue prilosec otc. You may need EGD. I want you to try strict diet measures.  Allergies- If those flare use claritin otc and flonase otc.  If your anxiety flares let us know and may refer back to psychiatry.  You can schedule wellness exam for early morning 8-8:30 in 1-2 months. Come in fasting.   Follow up 7-10 days for any residual back pain or as needed.

## 2014-11-15 NOTE — Assessment & Plan Note (Signed)
Depression- hx of with anxiety but doing well for one year

## 2014-12-28 ENCOUNTER — Telehealth: Payer: Self-pay | Admitting: Medical

## 2014-12-28 NOTE — Telephone Encounter (Signed)
Pre Visit letter sent  °

## 2015-01-18 ENCOUNTER — Telehealth: Payer: Self-pay | Admitting: *Deleted

## 2015-01-18 ENCOUNTER — Encounter: Payer: Self-pay | Admitting: *Deleted

## 2015-01-18 ENCOUNTER — Encounter: Payer: Self-pay | Admitting: Medical

## 2015-01-18 NOTE — Telephone Encounter (Signed)
Pre-Visit Call completed with patient and chart updated.   Pre-Visit Info documented in Specialty Comments under [employer].    

## 2015-01-19 ENCOUNTER — Other Ambulatory Visit: Payer: Self-pay | Admitting: Medical

## 2015-01-19 ENCOUNTER — Telehealth: Payer: Self-pay | Admitting: Medical

## 2015-01-19 ENCOUNTER — Ambulatory Visit (INDEPENDENT_AMBULATORY_CARE_PROVIDER_SITE_OTHER): Payer: BLUE CROSS/BLUE SHIELD | Admitting: Medical

## 2015-01-19 ENCOUNTER — Encounter: Payer: Self-pay | Admitting: Medical

## 2015-01-19 VITALS — BP 131/82 | HR 77 | Temp 98.2°F | Ht 71.0 in | Wt 279.0 lb

## 2015-01-19 DIAGNOSIS — Z23 Encounter for immunization: Secondary | ICD-10-CM

## 2015-01-19 DIAGNOSIS — R7989 Other specified abnormal findings of blood chemistry: Secondary | ICD-10-CM

## 2015-01-19 DIAGNOSIS — Z0000 Encounter for general adult medical examination without abnormal findings: Secondary | ICD-10-CM | POA: Diagnosis not present

## 2015-01-19 LAB — POCT URINALYSIS DIPSTICK
BILIRUBIN UA: NEGATIVE
Blood, UA: NEGATIVE
GLUCOSE UA: NEGATIVE
Ketones, UA: NEGATIVE
LEUKOCYTES UA: NEGATIVE
NITRITE UA: NEGATIVE
Protein, UA: 0.15
UROBILINOGEN UA: 2
pH, UA: 5.5

## 2015-01-19 LAB — CBC
HEMATOCRIT: 49.2 % (ref 39.0–52.0)
Hemoglobin: 16.6 g/dL (ref 13.0–17.0)
MCHC: 33.7 g/dL (ref 30.0–36.0)
MCV: 88.7 fl (ref 78.0–100.0)
Platelets: 232 10*3/uL (ref 150.0–400.0)
RBC: 5.55 Mil/uL (ref 4.22–5.81)
RDW: 13.6 % (ref 11.5–15.5)
WBC: 8 10*3/uL (ref 4.0–10.5)

## 2015-01-19 LAB — COMPREHENSIVE METABOLIC PANEL
ALBUMIN: 4.2 g/dL (ref 3.5–5.2)
ALK PHOS: 78 U/L (ref 39–117)
ALT: 53 U/L (ref 0–53)
AST: 27 U/L (ref 0–37)
BUN: 10 mg/dL (ref 6–23)
CHLORIDE: 104 meq/L (ref 96–112)
CO2: 26 mEq/L (ref 19–32)
Calcium: 9.2 mg/dL (ref 8.4–10.5)
Creatinine, Ser: 0.77 mg/dL (ref 0.40–1.50)
GFR: 123.86 mL/min (ref >60.00)
Glucose, Bld: 89 mg/dL (ref 70–99)
Potassium: 3.9 mEq/L (ref 3.5–5.1)
SODIUM: 136 meq/L (ref 135–145)
TOTAL PROTEIN: 7.6 g/dL (ref 6.0–8.3)
Total Bilirubin: 0.5 mg/dL (ref 0.2–1.2)

## 2015-01-19 LAB — LIPID PANEL
CHOL/HDL RATIO: 5
Cholesterol: 169 mg/dL (ref 0–200)
HDL: 33.2 mg/dL — ABNORMAL LOW (ref >39.00)
NONHDL: 135.8
TRIGLYCERIDES: 203 mg/dL — AB (ref 0.0–149.0)
VLDL: 40.6 mg/dL — ABNORMAL HIGH (ref 0.0–40.0)

## 2015-01-19 LAB — LDL CHOLESTEROL, DIRECT: Direct LDL: 106 mg/dL

## 2015-01-19 LAB — TSH: TSH: 1.71 u[IU]/mL (ref 0.35–4.50)

## 2015-01-19 MED ORDER — TETANUS-DIPHTH-ACELL PERTUSSIS 5-2.5-18.5 LF-MCG/0.5 IM SUSP
0.5000 mL | Freq: Once | INTRAMUSCULAR | Status: AC
  Administered 2015-01-19: 0.5 mL via INTRAMUSCULAR

## 2015-01-19 NOTE — Telephone Encounter (Signed)
Note to lpn. 

## 2015-01-19 NOTE — Progress Notes (Signed)
Pre visit review using our clinic review tool, if applicable. No additional management support is needed unless otherwise documented below in the visit note. 

## 2015-01-19 NOTE — Progress Notes (Signed)
Subjective:    Patient ID: Glenn Mendoza, male    DOB: 1980/12/04, 34 y.o.   MRN: [id number]  HPI  Pt in for a physical. Pt is fasting. At least 2 years since last physical.  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  Pt last tetanus in 2002. Also mild puncture wound lt thumb about 2 wks ago. This area is now healed.   Review of Systems  Constitutional: Negative for fever, chills, diaphoresis, activity change and fatigue.       Note overall. Pt chronic conditions area all under control.  Respiratory: Negative for cough, chest tightness and shortness of breath.   Cardiovascular: Negative for chest pain, palpitations and leg swelling.  Gastrointestinal: Negative for nausea, vomiting and abdominal pain.  Musculoskeletal: Negative for neck pain and neck stiffness.  Neurological: Negative for dizziness, tremors, seizures, syncope, facial asymmetry, speech difficulty, weakness, light-headedness, numbness and headaches.  Psychiatric/Behavioral: Negative for behavioral problems, confusion and agitation. The patient is not nervous/anxious.     Past Medical History  Diagnosis Date  . Acid reflux   . Depression   . PTSD (post-traumatic stress disorder)   . Allergy   . Anxiety     History   Social History  . Marital Status: Married    Spouse Name: N/A  . Number of Children: N/A  . Years of Education: N/A   Occupational History  . Not on file.   Social History Main Topics  . Smoking status: Current Some Day Smoker -- 0.50 packs/day for .2 years    Types: Cigarettes  . Smokeless tobacco: Not on file  . Alcohol Use: No  . Drug Use: Not on file  . Sexual Activity: Yes   Other Topics Concern  . Not on file   Social History Narrative    No past surgical history on file.  Family History  Problem Relation Age of Onset  . Depression Mother   . Diabetes Mother     . Alcohol abuse Father   . Hypertension Father   . Alcohol abuse Sister   . Alcohol abuse Brother     Allergies  Allergen Reactions  . Wellbutrin [Bupropion] Hives    On palms and soles  . Codeine Itching and Anxiety    Current Outpatient Prescriptions on File Prior to Visit  Medication Sig Dispense Refill  . diclofenac (VOLTAREN) 75 MG EC tablet Take 1 tablet (75 mg total) by mouth 2 (two) times daily. 30 tablet 0  . omeprazole (PRILOSEC) 20 MG capsule      No current facility-administered medications on file prior to visit.    BP 131/82 mmHg  Pulse 77  Temp(Src) 98.2 F (36.8 C) (Oral)  Ht 5\' 11"  (1.803 m)  Wt 279 lb (126.554 kg)  BMI 38.93 kg/m2  SpO2 97%       Objective:   Physical Exam  General Mental Status- Alert. Orientation- Oriented x3.  Build and Nutrition- Well nourished and Well Developed.  Skin General:-Normal. Color- Normal color. Moisture- Normal. Temperature-Warm.  HEENT  Ears- Normal. Auditory Canal- Bilateral-Normal. Tympanic Membrane- Bilateral-Normal. Eye Fundi-Bilateral-Normal. Pupil- bilateral- Direct reaction to light normal. Nose & Sinuses- Normal. Nostrils-Bilateral- Normal. Mouth & Throat-Normal.  Neck Neck- No Bruits or Masses. Trachea midline.  Thyroid- Normal.  Chest and Lung Exam Percussion: Quality and Intensity-Percussion normal. Percussion of the chest reveals- No Dullness.  Palpation: Palpation of the  chest reveals- Non-tender- No dullness. Auscultation: Breath Sounds- Normal.  Adventitous Sounds:-No adventitious sounds.  Cardiovascular Inspection:- No Heaves. Auscultation:-Normal sinus rhythm without murmur gallop, S1 WNL and S2 WNL.  Abdomen Inspection:-Inspection Normal. Inspection of the abdomen reveals- No hernias Palpation/Percussion:- Palpation and Percussion of the Abdomen reveal- Non Tender and No Palpable abdominal masses. Liver: Other Characteristics- No hepatomegaly. Spleen:Other Characteristics-  No Splenomegaly. Auscultation:- Auscultation of the abdomen reveals- Bowel sounds normal and No Abdominal bruits.  Male Genitourinary Urethra:- No discharge. Penis- Circumcised. Scrotum- No masses. Testes- Bilateral-Normal.    Neurologic Mental Status:- Normal. Cranial Nerves:-Normal Bilaterally. Motor:-Normal. Strength:5/5 normal muscle strength-All Muscles. General Assessment of Reflexes: Right Knee-2+. Left Knee- 2+. Coordination-Normal. Gait- Normal.  Meningeal Signs- None.  Musculoskeletal Global Assessment General-Joints show full range of motion without obvious deformity and Normal muscle mass. Strength in upper and lower extremities.  Lymphatics General lymphatics Description- No generalized lymphadenopathy.      Assessment & Plan:

## 2015-01-19 NOTE — Patient Instructions (Signed)
Wellness examination Will get cbc,cmp, tsh,lipid panel, UA today.  Also tdap today. Pt will check to see if hiv screening covered by his insurance. Then I will place future order if he wants.    Preventive Care for Adults A healthy lifestyle and preventive care can promote health and wellness. Preventive health guidelines for men include the following key practices:  A routine yearly physical is a good way to check with your health care provider about your health and preventative screening. It is a chance to share any concerns and updates on your health and to receive a thorough exam.  Visit your dentist for a routine exam and preventative care every 6 months. Brush your teeth twice a day and floss once a day. Good oral hygiene prevents tooth decay and gum disease.  The frequency of eye exams is based on your age, health, family medical history, use of contact lenses, and other factors. Follow your health care provider's recommendations for frequency of eye exams.  Eat a healthy diet. Foods such as vegetables, fruits, whole grains, low-fat dairy products, and lean protein foods contain the nutrients you need without too many calories. Decrease your intake of foods high in solid fats, added sugars, and salt. Eat the right amount of calories for you.Get information about a proper diet from your health care provider, if necessary.  Regular physical exercise is one of the most important things you can do for your health. Most adults should get at least 150 minutes of moderate-intensity exercise (any activity that increases your heart rate and causes you to sweat) each week. In addition, most adults need muscle-strengthening exercises on 2 or more days a week.  Maintain a healthy weight. The body mass index (BMI) is a screening tool to identify possible weight problems. It provides an estimate of body fat based on height and weight. Your health care provider can find your BMI and can help you achieve  or maintain a healthy weight.For adults 20 years and older:  A BMI below 18.5 is considered underweight.  A BMI of 18.5 to 24.9 is normal.  A BMI of 25 to 29.9 is considered overweight.  A BMI of 30 and above is considered obese.  Maintain normal blood lipids and cholesterol levels by exercising and minimizing your intake of saturated fat. Eat a balanced diet with plenty of fruit and vegetables. Blood tests for lipids and cholesterol should begin at age 40 and be repeated every 5 years. If your lipid or cholesterol levels are high, you are over 50, or you are at high risk for heart disease, you may need your cholesterol levels checked more frequently.Ongoing high lipid and cholesterol levels should be treated with medicines if diet and exercise are not working.  If you smoke, find out from your health care provider how to quit. If you do not use tobacco, do not start.  Lung cancer screening is recommended for adults aged 87-80 years who are at high risk for developing lung cancer because of a history of smoking. A yearly low-dose CT scan of the lungs is recommended for people who have at least a 30-pack-year history of smoking and are a current smoker or have quit within the past 15 years. A pack year of smoking is smoking an average of 1 pack of cigarettes a day for 1 year (for example: 1 pack a day for 30 years or 2 packs a day for 15 years). Yearly screening should continue until the smoker has stopped smoking for  at least 15 years. Yearly screening should be stopped for people who develop a health problem that would prevent them from having lung cancer treatment.  If you choose to drink alcohol, do not have more than 2 drinks per day. One drink is considered to be 12 ounces (355 mL) of beer, 5 ounces (148 mL) of wine, or 1.5 ounces (44 mL) of liquor.  Avoid use of street drugs. Do not share needles with anyone. Ask for help if you need support or instructions about stopping the use of  drugs.  High blood pressure causes heart disease and increases the risk of stroke. Your blood pressure should be checked at least every 1-2 years. Ongoing high blood pressure should be treated with medicines, if weight loss and exercise are not effective.  If you are 69-7 years old, ask your health care provider if you should take aspirin to prevent heart disease.  Diabetes screening involves taking a blood sample to check your fasting blood sugar level. This should be done once every 3 years, after age 56, if you are within normal weight and without risk factors for diabetes. Testing should be considered at a younger age or be carried out more frequently if you are overweight and have at least 1 risk factor for diabetes.  Colorectal cancer can be detected and often prevented. Most routine colorectal cancer screening begins at the age of 32 and continues through age 13. However, your health care provider may recommend screening at an earlier age if you have risk factors for colon cancer. On a yearly basis, your health care provider may provide home test kits to check for hidden blood in the stool. Use of a small camera at the end of a tube to directly examine the colon (sigmoidoscopy or colonoscopy) can detect the earliest forms of colorectal cancer. Talk to your health care provider about this at age 48, when routine screening begins. Direct exam of the colon should be repeated every 5-10 years through age 27, unless early forms of precancerous polyps or small growths are found.  People who are at an increased risk for hepatitis B should be screened for this virus. You are considered at high risk for hepatitis B if:  You were born in a country where hepatitis B occurs often. Talk with your health care provider about which countries are considered high risk.  Your parents were born in a high-risk country and you have not received a shot to protect against hepatitis B (hepatitis B vaccine).  You have  HIV or AIDS.  You use needles to inject street drugs.  You live with, or have sex with, someone who has hepatitis B.  You are a man who has sex with other men (MSM).  You get hemodialysis treatment.  You take certain medicines for conditions such as cancer, organ transplantation, and autoimmune conditions.  Hepatitis C blood testing is recommended for all people born from [birth year] through 1965 and any individual with known risks for hepatitis C.  Practice safe sex. Use condoms and avoid high-risk sexual practices to reduce the spread of sexually transmitted infections (STIs). STIs include gonorrhea, chlamydia, syphilis, trichomonas, herpes, HPV, and human immunodeficiency virus (HIV). Herpes, HIV, and HPV are viral illnesses that have no cure. They can result in disability, cancer, and death.  If you are at risk of being infected with HIV, it is recommended that you take a prescription medicine daily to prevent HIV infection. This is called preexposure prophylaxis (PrEP). You are considered  at risk if:  You are a man who has sex with other men (MSM) and have other risk factors.  You are a heterosexual man, are sexually active, and are at increased risk for HIV infection.  You take drugs by injection.  You are sexually active with a partner who has HIV.  Talk with your health care provider about whether you are at high risk of being infected with HIV. If you choose to begin PrEP, you should first be tested for HIV. You should then be tested every 3 months for as long as you are taking PrEP.  A one-time screening for abdominal aortic aneurysm (AAA) and surgical repair of large AAAs by ultrasound are recommended for men ages 42 to 51 years who are current or former smokers.  Healthy men should no longer receive prostate-specific antigen (PSA) blood tests as part of routine cancer screening. Talk with your health care provider about prostate cancer screening.  Testicular cancer screening is  not recommended for adult males who have no symptoms. Screening includes self-exam, a health care provider exam, and other screening tests. Consult with your health care provider about any symptoms you have or any concerns you have about testicular cancer.  Use sunscreen. Apply sunscreen liberally and repeatedly throughout the day. You should seek shade when your shadow is shorter than you. Protect yourself by wearing long sleeves, pants, a wide-brimmed hat, and sunglasses year round, whenever you are outdoors.  Once a month, do a whole-body skin exam, using a mirror to look at the skin on your back. Tell your health care provider about new moles, moles that have irregular borders, moles that are larger than a pencil eraser, or moles that have changed in shape or color.  Stay current with required vaccines (immunizations).  Influenza vaccine. All adults should be immunized every year.  Tetanus, diphtheria, and acellular pertussis (Td, Tdap) vaccine. An adult who has not previously received Tdap or who does not know his vaccine status should receive 1 dose of Tdap. This initial dose should be followed by tetanus and diphtheria toxoids (Td) booster doses every 10 years. Adults with an unknown or incomplete history of completing a 3-dose immunization series with Td-containing vaccines should begin or complete a primary immunization series including a Tdap dose. Adults should receive a Td booster every 10 years.  Varicella vaccine. An adult without evidence of immunity to varicella should receive 2 doses or a second dose if he has previously received 1 dose.  Human papillomavirus (HPV) vaccine. Males aged 75-21 years who have not received the vaccine previously should receive the 3-dose series. Males aged 22-26 years may be immunized. Immunization is recommended through the age of 74 years for any male who has sex with males and did not get any or all doses earlier. Immunization is recommended for any  person with an immunocompromised condition through the age of 18 years if he did not get any or all doses earlier. During the 3-dose series, the second dose should be obtained 4-8 weeks after the first dose. The third dose should be obtained 24 weeks after the first dose and 16 weeks after the second dose.  Zoster vaccine. One dose is recommended for adults aged 11 years or older unless certain conditions are present.  Measles, mumps, and rubella (MMR) vaccine. Adults born before [birth year] generally are considered immune to measles and mumps. Adults born in [birth year] or later should have 1 or more doses of MMR vaccine unless there is a contraindication  to the vaccine or there is laboratory evidence of immunity to each of the three diseases. A routine second dose of MMR vaccine should be obtained at least 28 days after the first dose for students attending postsecondary schools, health care workers, or international travelers. People who received inactivated measles vaccine or an unknown type of measles vaccine during 1963-1967 should receive 2 doses of MMR vaccine. People who received inactivated mumps vaccine or an unknown type of mumps vaccine before 1979 and are at high risk for mumps infection should consider immunization with 2 doses of MMR vaccine. Unvaccinated health care workers born before [birth year] who lack laboratory evidence of measles, mumps, or rubella immunity or laboratory confirmation of disease should consider measles and mumps immunization with 2 doses of MMR vaccine or rubella immunization with 1 dose of MMR vaccine.  Pneumococcal 13-valent conjugate (PCV13) vaccine. When indicated, a person who is uncertain of his immunization history and has no record of immunization should receive the PCV13 vaccine. An adult aged 75 years or older who has certain medical conditions and has not been previously immunized should receive 1 dose of PCV13 vaccine. This PCV13 should be followed with a dose of pneumococcal  polysaccharide (PPSV23) vaccine. The PPSV23 vaccine dose should be obtained at least 8 weeks after the dose of PCV13 vaccine. An adult aged 39 years or older who has certain medical conditions and previously received 1 or more doses of PPSV23 vaccine should receive 1 dose of PCV13. The PCV13 vaccine dose should be obtained 1 or more years after the last PPSV23 vaccine dose.  Pneumococcal polysaccharide (PPSV23) vaccine. When PCV13 is also indicated, PCV13 should be obtained first. All adults aged 32 years and older should be immunized. An adult younger than age 43 years who has certain medical conditions should be immunized. Any person who resides in a nursing home or long-term care facility should be immunized. An adult smoker should be immunized. People with an immunocompromised condition and certain other conditions should receive both PCV13 and PPSV23 vaccines. People with human immunodeficiency virus (HIV) infection should be immunized as soon as possible after diagnosis. Immunization during chemotherapy or radiation therapy should be avoided. Routine use of PPSV23 vaccine is not recommended for American Indians, [place] Natives, or people younger than 65 years unless there are medical conditions that require PPSV23 vaccine. When indicated, people who have unknown immunization and have no record of immunization should receive PPSV23 vaccine. One-time revaccination 5 years after the first dose of PPSV23 is recommended for people aged 19-64 years who have chronic kidney failure, nephrotic syndrome, asplenia, or immunocompromised conditions. People who received 1-2 doses of PPSV23 before age 71 years should receive another dose of PPSV23 vaccine at age 60 years or later if at least 5 years have passed since the previous dose. Doses of PPSV23 are not needed for people immunized with PPSV23 at or after age 23 years.  Meningococcal vaccine. Adults with asplenia or persistent complement component deficiencies  should receive 2 doses of quadrivalent meningococcal conjugate (MenACWY-D) vaccine. The doses should be obtained at least 2 months apart. Microbiologists working with certain meningococcal bacteria, [employer] recruits, people at risk during an outbreak, and people who travel to or live in countries with a high rate of meningitis should be immunized. A first-year college student up through age 14 years who is living in a residence hall should receive a dose if he did not receive a dose on or after his 16th birthday. Adults who have certain high-risk  conditions should receive one or more doses of vaccine.  Hepatitis A vaccine. Adults who wish to be protected from this disease, have certain high-risk conditions, work with hepatitis A-infected animals, work in hepatitis A research labs, or travel to or work in countries with a high rate of hepatitis A should be immunized. Adults who were previously unvaccinated and who anticipate close contact with an international adoptee during the first 60 days after arrival in the [place] from a country with a high rate of hepatitis A should be immunized.  Hepatitis B vaccine. Adults should be immunized if they wish to be protected from this disease, have certain high-risk conditions, may be exposed to blood or other infectious body fluids, are household contacts or sex partners of hepatitis B positive people, are clients or workers in certain care facilities, or travel to or work in countries with a high rate of hepatitis B.  Haemophilus influenzae type b (Hib) vaccine. A previously unvaccinated person with asplenia or sickle cell disease or having a scheduled splenectomy should receive 1 dose of Hib vaccine. Regardless of previous immunization, a recipient of a hematopoietic stem cell transplant should receive a 3-dose series 6-12 months after his successful transplant. Hib vaccine is not recommended for adults with HIV infection. Preventive Service / Frequency Ages  27 to 59  Blood pressure check.** / Every 1 to 2 years.  Lipid and cholesterol check.** / Every 5 years beginning at age 68.  Hepatitis C blood test.** / For any individual with known risks for hepatitis C.  Skin self-exam. / Monthly.  Influenza vaccine. / Every year.  Tetanus, diphtheria, and acellular pertussis (Tdap, Td) vaccine.** / Consult your health care provider. 1 dose of Td every 10 years.  Varicella vaccine.** / Consult your health care provider.  HPV vaccine. / 3 doses over 6 months, if 104 or younger.  Measles, mumps, rubella (MMR) vaccine.** / You need at least 1 dose of MMR if you were born in 1957 or later. You may also need a second dose.  Pneumococcal 13-valent conjugate (PCV13) vaccine.** / Consult your health care provider.  Pneumococcal polysaccharide (PPSV23) vaccine.** / 1 to 2 doses if you smoke cigarettes or if you have certain conditions.  Meningococcal vaccine.** / 1 dose if you are age 69 to 61 years and a Market researcher living in a residence hall, or have one of several medical conditions. You may also need additional booster doses.  Hepatitis A vaccine.** / Consult your health care provider.  Hepatitis B vaccine.** / Consult your health care provider.  Haemophilus influenzae type b (Hib) vaccine.** / Consult your health care provider. Ages 41 to 39  Blood pressure check.** / Every 1 to 2 years.  Lipid and cholesterol check.** / Every 5 years beginning at age 56.  Lung cancer screening. / Every year if you are aged 56-80 years and have a 30-pack-year history of smoking and currently smoke or have quit within the past 15 years. Yearly screening is stopped once you have quit smoking for at least 15 years or develop a health problem that would prevent you from having lung cancer treatment.  Fecal occult blood test (FOBT) of stool. / Every year beginning at age 73 and continuing until age 43. You may not have to do this test if you get a  colonoscopy every 10 years.  Flexible sigmoidoscopy** or colonoscopy.** / Every 5 years for a flexible sigmoidoscopy or every 10 years for a colonoscopy beginning at age 22  and continuing until age 19.  Hepatitis C blood test.** / For all people born from [birth year] through 1965 and any individual with known risks for hepatitis C.  Skin self-exam. / Monthly.  Influenza vaccine. / Every year.  Tetanus, diphtheria, and acellular pertussis (Tdap/Td) vaccine.** / Consult your health care provider. 1 dose of Td every 10 years.  Varicella vaccine.** / Consult your health care provider.  Zoster vaccine.** / 1 dose for adults aged 10 years or older.  Measles, mumps, rubella (MMR) vaccine.** / You need at least 1 dose of MMR if you were born in 1957 or later. You may also need a second dose.  Pneumococcal 13-valent conjugate (PCV13) vaccine.** / Consult your health care provider.  Pneumococcal polysaccharide (PPSV23) vaccine.** / 1 to 2 doses if you smoke cigarettes or if you have certain conditions.  Meningococcal vaccine.** / Consult your health care provider.  Hepatitis A vaccine.** / Consult your health care provider.  Hepatitis B vaccine.** / Consult your health care provider.  Haemophilus influenzae type b (Hib) vaccine.** / Consult your health care provider. Ages 20 and over  Blood pressure check.** / Every 1 to 2 years.  Lipid and cholesterol check.**/ Every 5 years beginning at age 44.  Lung cancer screening. / Every year if you are aged 26-80 years and have a 30-pack-year history of smoking and currently smoke or have quit within the past 15 years. Yearly screening is stopped once you have quit smoking for at least 15 years or develop a health problem that would prevent you from having lung cancer treatment.  Fecal occult blood test (FOBT) of stool. / Every year beginning at age 75 and continuing until age 4. You may not have to do this test if you get a colonoscopy every 10  years.  Flexible sigmoidoscopy** or colonoscopy.** / Every 5 years for a flexible sigmoidoscopy or every 10 years for a colonoscopy beginning at age 67 and continuing until age 10.  Hepatitis C blood test.** / For all people born from [birth year] through 1964 and any individual with known risks for hepatitis C.  Abdominal aortic aneurysm (AAA) screening.** / A one-time screening for ages 43 to 65 years who are current or former smokers.  Skin self-exam. / Monthly.  Influenza vaccine. / Every year.  Tetanus, diphtheria, and acellular pertussis (Tdap/Td) vaccine.** / 1 dose of Td every 10 years.  Varicella vaccine.** / Consult your health care provider.  Zoster vaccine.** / 1 dose for adults aged 40 years or older.  Pneumococcal 13-valent conjugate (PCV13) vaccine.** / Consult your health care provider.  Pneumococcal polysaccharide (PPSV23) vaccine.** / 1 dose for all adults aged 78 years and older.  Meningococcal vaccine.** / Consult your health care provider.  Hepatitis A vaccine.** / Consult your health care provider.  Hepatitis B vaccine.** / Consult your health care provider.  Haemophilus influenzae type b (Hib) vaccine.** / Consult your health care provider. **Family history and personal history of risk and conditions may change your health care provider's recommendations. Document Released: 10/02/2001 Document Revised: 08/11/2013 Document Reviewed: 01/01/2011 [facility] Patient Information 2015 [place], [place]. This information is not intended to replace advice given to you by your health care provider. Make sure you discuss any questions you have with your health care provider.

## 2015-01-19 NOTE — Assessment & Plan Note (Signed)
Will get cbc,cmp, tsh,lipid panel, UA today.  Also tdap today. Pt will check to see if hiv screening covered by his insurance. Then I will place future order if he wants.

## 2016-01-14 DIAGNOSIS — H5213 Myopia, bilateral: Secondary | ICD-10-CM | POA: Diagnosis not present

## 2016-01-14 DIAGNOSIS — H52223 Regular astigmatism, bilateral: Secondary | ICD-10-CM | POA: Diagnosis not present

## 2016-01-23 ENCOUNTER — Telehealth: Payer: Self-pay | Admitting: Medical

## 2016-01-23 ENCOUNTER — Encounter: Payer: Self-pay | Admitting: Medical

## 2016-01-23 ENCOUNTER — Telehealth: Payer: Self-pay | Admitting: *Deleted

## 2016-01-23 ENCOUNTER — Ambulatory Visit (HOSPITAL_BASED_OUTPATIENT_CLINIC_OR_DEPARTMENT_OTHER)
Admission: RE | Admit: 2016-01-23 | Discharge: 2016-01-23 | Disposition: A | Discharge status: Home / Self Care | Payer: 59 | Source: Ambulatory Visit | Attending: Medical | Admitting: Medical

## 2016-01-23 ENCOUNTER — Ambulatory Visit (INDEPENDENT_AMBULATORY_CARE_PROVIDER_SITE_OTHER): Payer: 59 | Admitting: Medical

## 2016-01-23 VITALS — BP 118/90 | HR 84 | Temp 98.0°F | Ht 71.0 in | Wt 300.2 lb

## 2016-01-23 DIAGNOSIS — R748 Abnormal levels of other serum enzymes: Secondary | ICD-10-CM

## 2016-01-23 DIAGNOSIS — L989 Disorder of the skin and subcutaneous tissue, unspecified: Secondary | ICD-10-CM | POA: Diagnosis not present

## 2016-01-23 DIAGNOSIS — R7989 Other specified abnormal findings of blood chemistry: Secondary | ICD-10-CM | POA: Diagnosis not present

## 2016-01-23 DIAGNOSIS — Z114 Encounter for screening for human immunodeficiency virus [HIV]: Secondary | ICD-10-CM | POA: Diagnosis not present

## 2016-01-23 DIAGNOSIS — M545 Low back pain, unspecified: Secondary | ICD-10-CM

## 2016-01-23 DIAGNOSIS — Z0001 Encounter for general adult medical examination with abnormal findings: Secondary | ICD-10-CM

## 2016-01-23 DIAGNOSIS — M47897 Other spondylosis, lumbosacral region: Secondary | ICD-10-CM | POA: Diagnosis not present

## 2016-01-23 DIAGNOSIS — M47816 Spondylosis without myelopathy or radiculopathy, lumbar region: Secondary | ICD-10-CM | POA: Diagnosis not present

## 2016-01-23 DIAGNOSIS — E785 Hyperlipidemia, unspecified: Secondary | ICD-10-CM

## 2016-01-23 DIAGNOSIS — Z0000 Encounter for general adult medical examination without abnormal findings: Secondary | ICD-10-CM | POA: Diagnosis not present

## 2016-01-23 DIAGNOSIS — Z1159 Encounter for screening for other viral diseases: Secondary | ICD-10-CM | POA: Diagnosis not present

## 2016-01-23 LAB — HIV ANTIBODY (ROUTINE TESTING W REFLEX): HIV 1&2 Ab, 4th Generation: NONREACTIVE

## 2016-01-23 LAB — POC URINALSYSI DIPSTICK (AUTOMATED)
BILIRUBIN UA: NEGATIVE
GLUCOSE UA: NEGATIVE
Ketones, UA: NEGATIVE
Leukocytes, UA: NEGATIVE
NITRITE UA: NEGATIVE
Protein, UA: NEGATIVE
RBC UA: NEGATIVE
UROBILINOGEN UA: 0.2
pH, UA: 5.5

## 2016-01-23 LAB — COMPREHENSIVE METABOLIC PANEL
ALBUMIN: 4.1 g/dL (ref 3.5–5.2)
ALT: 63 U/L — ABNORMAL HIGH (ref 0–53)
AST: 41 U/L — ABNORMAL HIGH (ref 0–37)
Alkaline Phosphatase: 79 U/L (ref 39–117)
BUN: 11 mg/dL (ref 6–23)
CALCIUM: 9.4 mg/dL (ref 8.4–10.5)
CHLORIDE: 104 meq/L (ref 96–112)
CO2: 25 mEq/L (ref 19–32)
Creatinine, Ser: 0.78 mg/dL (ref 0.40–1.50)
GFR: 121.28 mL/min (ref >60.00)
Glucose, Bld: 94 mg/dL (ref 70–99)
POTASSIUM: 4.1 meq/L (ref 3.5–5.1)
SODIUM: 137 meq/L (ref 135–145)
Total Bilirubin: 0.7 mg/dL (ref 0.2–1.2)
Total Protein: 7.4 g/dL (ref 6.0–8.3)

## 2016-01-23 LAB — CBC WITH DIFFERENTIAL/PLATELET
BASOS PCT: 0.7 % (ref 0.0–3.0)
Basophils Absolute: 0 10*3/uL (ref 0.0–0.1)
EOS PCT: 3.4 % (ref 0.0–5.0)
Eosinophils Absolute: 0.2 10*3/uL (ref 0.0–0.7)
HEMATOCRIT: 46.8 % (ref 39.0–52.0)
HEMOGLOBIN: 16.2 g/dL (ref 13.0–17.0)
Lymphocytes Relative: 44.1 % (ref 12.0–46.0)
Lymphs Abs: 2.9 10*3/uL (ref 0.7–4.0)
MCHC: 34.6 g/dL (ref 30.0–36.0)
MCV: 87.2 fl (ref 78.0–100.0)
MONO ABS: 0.4 10*3/uL (ref 0.1–1.0)
Monocytes Relative: 6.7 % (ref 3.0–12.0)
Neutro Abs: 2.9 10*3/uL (ref 1.4–7.7)
Neutrophils Relative %: 45.1 % (ref 43.0–77.0)
Platelets: 215 10*3/uL (ref 150.0–400.0)
RBC: 5.37 Mil/uL (ref 4.22–5.81)
RDW: 13 % (ref 11.5–15.5)
WBC: 6.5 10*3/uL (ref 4.0–10.5)

## 2016-01-23 LAB — LIPID PANEL
CHOL/HDL RATIO: 7
CHOLESTEROL: 178 mg/dL (ref 0–200)
HDL: 26.8 mg/dL — AB (ref >39.00)
NONHDL: 151.54
TRIGLYCERIDES: 224 mg/dL — AB (ref 0.0–149.0)
VLDL: 44.8 mg/dL — ABNORMAL HIGH (ref 0.0–40.0)

## 2016-01-23 LAB — LDL CHOLESTEROL, DIRECT: LDL DIRECT: 113 mg/dL

## 2016-01-23 LAB — TSH: TSH: 2.36 u[IU]/mL (ref 0.35–4.50)

## 2016-01-23 MED ORDER — OMEPRAZOLE 20 MG PO CPDR: 20.0000 mg | DELAYED_RELEASE_CAPSULE | Freq: Every day | ORAL | Status: DC

## 2016-01-23 MED ORDER — DICLOFENAC SODIUM 75 MG PO TBEC: 75.0000 mg | DELAYED_RELEASE_TABLET | Freq: Two times a day (BID) | ORAL | Status: DC

## 2016-01-23 MED FILL — OMEPRAZOLE DR 20 MG CAPSULE: 20 | 30 days supply | Qty: 30 | Fill #0

## 2016-01-23 MED FILL — DICLOFENAC SOD EC 75 MG TAB: 75 | 15 days supply | Qty: 30 | Fill #0

## 2016-01-23 NOTE — Telephone Encounter (Signed)
Called and spoke with the pt and informed him of recent x-ray results and note.  Pt verbalized understanding and agreed to the new prescription.  New prescription sent to the pharmacy.//AB/CMA

## 2016-01-23 NOTE — Progress Notes (Signed)
Pre visit review using our clinic review tool, if applicable. No additional management support is needed unless otherwise documented below in the visit note. 

## 2016-01-23 NOTE — Telephone Encounter (Signed)
rx diclofenac sent to his pharmacy.

## 2016-01-23 NOTE — Patient Instructions (Addendum)
Wellness examination Will get cbc, cmp, tsh, lipid panel and UA.  (hiv screening)  Recommend diet and exercise. Try to loose some weight. This will be beneficial in the long run.  Will get xrays of lumbar spine today.  For your large mole on your back I think will go ahead and refer to dermatologist. The size is one feature of concern. No baseline for comparison?  Preventive Care for Adults, Male A healthy lifestyle and preventive care can promote health and wellness. Preventive health guidelines for men include the following key practices:  A routine yearly physical is a good way to check with your health care provider about your health and preventative screening. It is a chance to share any concerns and updates on your health and to receive a thorough exam.  Visit your dentist for a routine exam and preventative care every 6 months. Brush your teeth twice a day and floss once a day. Good oral hygiene prevents tooth decay and gum disease.  The frequency of eye exams is based on your age, health, family medical history, use of contact lenses, and other factors. Follow your health care provider's recommendations for frequency of eye exams.  Eat a healthy diet. Foods such as vegetables, fruits, whole grains, low-fat dairy products, and lean protein foods contain the nutrients you need without too many calories. Decrease your intake of foods high in solid fats, added sugars, and salt. Eat the right amount of calories for you.Get information about a proper diet from your health care provider, if necessary.  Regular physical exercise is one of the most important things you can do for your health. Most adults should get at least 150 minutes of moderate-intensity exercise (any activity that increases your heart rate and causes you to sweat) each week. In addition, most adults need muscle-strengthening exercises on 2 or more days a week.  Maintain a healthy weight. The body mass index (BMI) is a  screening tool to identify possible weight problems. It provides an estimate of body fat based on height and weight. Your health care provider can find your BMI and can help you achieve or maintain a healthy weight.For adults 20 years and older:  A BMI below 18.5 is considered underweight.  A BMI of 18.5 to 24.9 is normal.  A BMI of 25 to 29.9 is considered overweight.  A BMI of 30 and above is considered obese.  Maintain normal blood lipids and cholesterol levels by exercising and minimizing your intake of saturated fat. Eat a balanced diet with plenty of fruit and vegetables. Blood tests for lipids and cholesterol should begin at age 29 and be repeated every 5 years. If your lipid or cholesterol levels are high, you are over 50, or you are at high risk for heart disease, you may need your cholesterol levels checked more frequently.Ongoing high lipid and cholesterol levels should be treated with medicines if diet and exercise are not working.  If you smoke, find out from your health care provider how to quit. If you do not use tobacco, do not start.  Lung cancer screening is recommended for adults aged 50-80 years who are at high risk for developing lung cancer because of a history of smoking. A yearly low-dose CT scan of the lungs is recommended for people who have at least a 30-pack-year history of smoking and are a current smoker or have quit within the past 15 years. A pack year of smoking is smoking an average of 1 pack of cigarettes  a day for 1 year (for example: 1 pack a day for 30 years or 2 packs a day for 15 years). Yearly screening should continue until the smoker has stopped smoking for at least 15 years. Yearly screening should be stopped for people who develop a health problem that would prevent them from having lung cancer treatment.  If you choose to drink alcohol, do not have more than 2 drinks per day. One drink is considered to be 12 ounces (355 mL) of beer, 5 ounces (148 mL) of  wine, or 1.5 ounces (44 mL) of liquor.  Avoid use of street drugs. Do not share needles with anyone. Ask for help if you need support or instructions about stopping the use of drugs.  High blood pressure causes heart disease and increases the risk of stroke. Your blood pressure should be checked at least every 1-2 years. Ongoing high blood pressure should be treated with medicines, if weight loss and exercise are not effective.  If you are 57-73 years old, ask your health care provider if you should take aspirin to prevent heart disease.  Diabetes screening is done by taking a blood sample to check your blood glucose level after you have not eaten for a certain period of time (fasting). If you are not overweight and you do not have risk factors for diabetes, you should be screened once every 3 years starting at age 71. If you are overweight or obese and you are 76-16 years of age, you should be screened for diabetes every year as part of your cardiovascular risk assessment.  Colorectal cancer can be detected and often prevented. Most routine colorectal cancer screening begins at the age of 87 and continues through age 54. However, your health care provider may recommend screening at an earlier age if you have risk factors for colon cancer. On a yearly basis, your health care provider may provide home test kits to check for hidden blood in the stool. Use of a small camera at the end of a tube to directly examine the colon (sigmoidoscopy or colonoscopy) can detect the earliest forms of colorectal cancer. Talk to your health care provider about this at age 47, when routine screening begins. Direct exam of the colon should be repeated every 5-10 years through age 56, unless early forms of precancerous polyps or small growths are found.  People who are at an increased risk for hepatitis B should be screened for this virus. You are considered at high risk for hepatitis B if:  You were born in a country where  hepatitis B occurs often. Talk with your health care provider about which countries are considered high risk.  Your parents were born in a high-risk country and you have not received a shot to protect against hepatitis B (hepatitis B vaccine).  You have HIV or AIDS.  You use needles to inject street drugs.  You live with, or have sex with, someone who has hepatitis B.  You are a man who has sex with other men (MSM).  You get hemodialysis treatment.  You take certain medicines for conditions such as cancer, organ transplantation, and autoimmune conditions.  Hepatitis C blood testing is recommended for all people born from [birth year] through 1965 and any individual with known risks for hepatitis C.  Practice safe sex. Use condoms and avoid high-risk sexual practices to reduce the spread of sexually transmitted infections (STIs). STIs include gonorrhea, chlamydia, syphilis, trichomonas, herpes, HPV, and human immunodeficiency virus (HIV). Herpes, HIV, and  HPV are viral illnesses that have no cure. They can result in disability, cancer, and death.  If you are a man who has sex with other men, you should be screened at least once per year for:  HIV.  Urethral, rectal, and pharyngeal infection of gonorrhea, chlamydia, or both.  If you are at risk of being infected with HIV, it is recommended that you take a prescription medicine daily to prevent HIV infection. This is called preexposure prophylaxis (PrEP). You are considered at risk if:  You are a man who has sex with other men (MSM) and have other risk factors.  You are a heterosexual man, are sexually active, and are at increased risk for HIV infection.  You take drugs by injection.  You are sexually active with a partner who has HIV.  Talk with your health care provider about whether you are at high risk of being infected with HIV. If you choose to begin PrEP, you should first be tested for HIV. You should then be tested every 3 months for  as long as you are taking PrEP.  A one-time screening for abdominal aortic aneurysm (AAA) and surgical repair of large AAAs by ultrasound are recommended for men ages 11 to 40 years who are current or former smokers.  Healthy men should no longer receive prostate-specific antigen (PSA) blood tests as part of routine cancer screening. Talk with your health care provider about prostate cancer screening.  Testicular cancer screening is not recommended for adult males who have no symptoms. Screening includes self-exam, a health care provider exam, and other screening tests. Consult with your health care provider about any symptoms you have or any concerns you have about testicular cancer.  Use sunscreen. Apply sunscreen liberally and repeatedly throughout the day. You should seek shade when your shadow is shorter than you. Protect yourself by wearing long sleeves, pants, a wide-brimmed hat, and sunglasses year round, whenever you are outdoors.  Once a month, do a whole-body skin exam, using a mirror to look at the skin on your back. Tell your health care provider about new moles, moles that have irregular borders, moles that are larger than a pencil eraser, or moles that have changed in shape or color.  Stay current with required vaccines (immunizations).  Influenza vaccine. All adults should be immunized every year.  Tetanus, diphtheria, and acellular pertussis (Td, Tdap) vaccine. An adult who has not previously received Tdap or who does not know his vaccine status should receive 1 dose of Tdap. This initial dose should be followed by tetanus and diphtheria toxoids (Td) booster doses every 10 years. Adults with an unknown or incomplete history of completing a 3-dose immunization series with Td-containing vaccines should begin or complete a primary immunization series including a Tdap dose. Adults should receive a Td booster every 10 years.  Varicella vaccine. An adult without evidence of immunity to  varicella should receive 2 doses or a second dose if he has previously received 1 dose.  Human papillomavirus (HPV) vaccine. Males aged 11-21 years who have not received the vaccine previously should receive the 3-dose series. Males aged 22-26 years may be immunized. Immunization is recommended through the age of 76 years for any male who has sex with males and did not get any or all doses earlier. Immunization is recommended for any person with an immunocompromised condition through the age of 15 years if he did not get any or all doses earlier. During the 3-dose series, the second dose should  be obtained 4-8 weeks after the first dose. The third dose should be obtained 24 weeks after the first dose and 16 weeks after the second dose.  Zoster vaccine. One dose is recommended for adults aged 62 years or older unless certain conditions are present.  Measles, mumps, and rubella (MMR) vaccine. Adults born before [birth year] generally are considered immune to measles and mumps. Adults born in [birth year] or later should have 1 or more doses of MMR vaccine unless there is a contraindication to the vaccine or there is laboratory evidence of immunity to each of the three diseases. A routine second dose of MMR vaccine should be obtained at least 28 days after the first dose for students attending postsecondary schools, health care workers, or international travelers. People who received inactivated measles vaccine or an unknown type of measles vaccine during 1963-1967 should receive 2 doses of MMR vaccine. People who received inactivated mumps vaccine or an unknown type of mumps vaccine before 1979 and are at high risk for mumps infection should consider immunization with 2 doses of MMR vaccine. Unvaccinated health care workers born before [birth year] who lack laboratory evidence of measles, mumps, or rubella immunity or laboratory confirmation of disease should consider measles and mumps immunization with 2 doses of MMR vaccine or  rubella immunization with 1 dose of MMR vaccine.  Pneumococcal 13-valent conjugate (PCV13) vaccine. When indicated, a person who is uncertain of his immunization history and has no record of immunization should receive the PCV13 vaccine. All adults 40 years of age and older should receive this vaccine. An adult aged 69 years or older who has certain medical conditions and has not been previously immunized should receive 1 dose of PCV13 vaccine. This PCV13 should be followed with a dose of pneumococcal polysaccharide (PPSV23) vaccine. Adults who are at high risk for pneumococcal disease should obtain the PPSV23 vaccine at least 8 weeks after the dose of PCV13 vaccine. Adults older than 34 years of age who have normal immune system function should obtain the PPSV23 vaccine dose at least 1 year after the dose of PCV13 vaccine.  Pneumococcal polysaccharide (PPSV23) vaccine. When PCV13 is also indicated, PCV13 should be obtained first. All adults aged 60 years and older should be immunized. An adult younger than age 30 years who has certain medical conditions should be immunized. Any person who resides in a nursing home or long-term care facility should be immunized. An adult smoker should be immunized. People with an immunocompromised condition and certain other conditions should receive both PCV13 and PPSV23 vaccines. People with human immunodeficiency virus (HIV) infection should be immunized as soon as possible after diagnosis. Immunization during chemotherapy or radiation therapy should be avoided. Routine use of PPSV23 vaccine is not recommended for American Indians, [place] Natives, or people younger than 65 years unless there are medical conditions that require PPSV23 vaccine. When indicated, people who have unknown immunization and have no record of immunization should receive PPSV23 vaccine. One-time revaccination 5 years after the first dose of PPSV23 is recommended for people aged 19-64 years who have  chronic kidney failure, nephrotic syndrome, asplenia, or immunocompromised conditions. People who received 1-2 doses of PPSV23 before age 53 years should receive another dose of PPSV23 vaccine at age 31 years or later if at least 5 years have passed since the previous dose. Doses of PPSV23 are not needed for people immunized with PPSV23 at or after age 61 years.  Meningococcal vaccine. Adults with asplenia or persistent complement component deficiencies should receive  2 doses of quadrivalent meningococcal conjugate (MenACWY-D) vaccine. The doses should be obtained at least 2 months apart. Microbiologists working with certain meningococcal bacteria, [employer], people at risk during an outbreak, and people who travel to or live in countries with a high rate of meningitis should be immunized. A first-year college student up through age 69 years who is living in a residence hall should receive a dose if he did not receive a dose on or after his 16th birthday. Adults who have certain high-risk conditions should receive one or more doses of vaccine.  Hepatitis A vaccine. Adults who wish to be protected from this disease, have chronic liver disease, work with hepatitis A-infected animals, work in hepatitis A research labs, or travel to or work in countries with a high rate of hepatitis A should be immunized. Adults who were previously unvaccinated and who anticipate close contact with an international adoptee during the first 60 days after arrival in the [place] from a country with a high rate of hepatitis A should be immunized.  Hepatitis B vaccine. Adults should be immunized if they wish to be protected from this disease, are under age 75 years and have diabetes, have chronic liver disease, have had more than one sex partner in the past 6 months, may be exposed to blood or other infectious body fluids, are household contacts or sex partners of hepatitis B positive people, are clients or workers in  certain care facilities, or travel to or work in countries with a high rate of hepatitis B.  Haemophilus influenzae type b (Hib) vaccine. A previously unvaccinated person with asplenia or sickle cell disease or having a scheduled splenectomy should receive 1 dose of Hib vaccine. Regardless of previous immunization, a recipient of a hematopoietic stem cell transplant should receive a 3-dose series 6-12 months after his successful transplant. Hib vaccine is not recommended for adults with HIV infection. Preventive Service / Frequency Ages 46 to 76  Blood pressure check.** / Every 3-5 years.  Lipid and cholesterol check.** / Every 5 years beginning at age 26.  Hepatitis C blood test.** / For any individual with known risks for hepatitis C.  Skin self-exam. / Monthly.  Influenza vaccine. / Every year.  Tetanus, diphtheria, and acellular pertussis (Tdap, Td) vaccine.** / Consult your health care provider. 1 dose of Td every 10 years.  Varicella vaccine.** / Consult your health care provider.  HPV vaccine. / 3 doses over 6 months, if 8 or younger.  Measles, mumps, rubella (MMR) vaccine.** / You need at least 1 dose of MMR if you were born in 1957 or later. You may also need a second dose.  Pneumococcal 13-valent conjugate (PCV13) vaccine.** / Consult your health care provider.  Pneumococcal polysaccharide (PPSV23) vaccine.** / 1 to 2 doses if you smoke cigarettes or if you have certain conditions.  Meningococcal vaccine.** / 1 dose if you are age 45 to 102 years and a Market researcher living in a residence hall, or have one of several medical conditions. You may also need additional booster doses.  Hepatitis A vaccine.** / Consult your health care provider.  Hepatitis B vaccine.** / Consult your health care provider.  Haemophilus influenzae type b (Hib) vaccine.** / Consult your health care provider. Ages 50 to 29  Blood pressure check.** / Every year.  Lipid and  cholesterol check.** / Every 5 years beginning at age 74.  Lung cancer screening. / Every year if you are aged 46-80 years and have a  30-pack-year history of smoking and currently smoke or have quit within the past 15 years. Yearly screening is stopped once you have quit smoking for at least 15 years or develop a health problem that would prevent you from having lung cancer treatment.  Fecal occult blood test (FOBT) of stool. / Every year beginning at age 21 and continuing until age 67. You may not have to do this test if you get a colonoscopy every 10 years.  Flexible sigmoidoscopy** or colonoscopy.** / Every 5 years for a flexible sigmoidoscopy or every 10 years for a colonoscopy beginning at age 44 and continuing until age 27.  Hepatitis C blood test.** / For all people born from [birth year] through 1965 and any individual with known risks for hepatitis C.  Skin self-exam. / Monthly.  Influenza vaccine. / Every year.  Tetanus, diphtheria, and acellular pertussis (Tdap/Td) vaccine.** / Consult your health care provider. 1 dose of Td every 10 years.  Varicella vaccine.** / Consult your health care provider.  Zoster vaccine.** / 1 dose for adults aged 69 years or older.  Measles, mumps, rubella (MMR) vaccine.** / You need at least 1 dose of MMR if you were born in 1956 or later. You may also need a second dose.  Pneumococcal 13-valent conjugate (PCV13) vaccine.** / Consult your health care provider.  Pneumococcal polysaccharide (PPSV23) vaccine.** / 1 to 2 doses if you smoke cigarettes or if you have certain conditions.  Meningococcal vaccine.** / Consult your health care provider.  Hepatitis A vaccine.** / Consult your health care provider.  Hepatitis B vaccine.** / Consult your health care provider.  Haemophilus influenzae type b (Hib) vaccine.** / Consult your health care provider. Ages 14 and over  Blood pressure check.** / Every year.  Lipid and cholesterol check.**/ Every 5 years  beginning at age 9.  Lung cancer screening. / Every year if you are aged 82-80 years and have a 30-pack-year history of smoking and currently smoke or have quit within the past 15 years. Yearly screening is stopped once you have quit smoking for at least 15 years or develop a health problem that would prevent you from having lung cancer treatment.  Fecal occult blood test (FOBT) of stool. / Every year beginning at age 48 and continuing until age 47. You may not have to do this test if you get a colonoscopy every 10 years.  Flexible sigmoidoscopy** or colonoscopy.** / Every 5 years for a flexible sigmoidoscopy or every 10 years for a colonoscopy beginning at age 76 and continuing until age 30.  Hepatitis C blood test.** / For all people born from [birth year] through 1964 and any individual with known risks for hepatitis C.  Abdominal aortic aneurysm (AAA) screening.** / A one-time screening for ages 34 to 55 years who are current or former smokers.  Skin self-exam. / Monthly.  Influenza vaccine. / Every year.  Tetanus, diphtheria, and acellular pertussis (Tdap/Td) vaccine.** / 1 dose of Td every 10 years.  Varicella vaccine.** / Consult your health care provider.  Zoster vaccine.** / 1 dose for adults aged 48 years or older.  Pneumococcal 13-valent conjugate (PCV13) vaccine.** / 1 dose for all adults aged 77 years and older.  Pneumococcal polysaccharide (PPSV23) vaccine.** / 1 dose for all adults aged 15 years and older.  Meningococcal vaccine.** / Consult your health care provider.  Hepatitis A vaccine.** / Consult your health care provider.  Hepatitis B vaccine.** / Consult your health care provider.  Haemophilus influenzae type b (Hib) vaccine.** /  Consult your health care provider. **Family history and personal history of risk and conditions may change your health care provider's recommendations.   This information is not intended to replace advice given to you by your health care  provider. Make sure you discuss any questions you have with your health care provider.   Document Released: 10/02/2001 Document Revised: 08/27/2014 Document Reviewed: 01/01/2011 Elsevier Interactive Patient Education 2016 [place] examination Will get cbc, cmp, tsh, lipid panel and UA.

## 2016-01-23 NOTE — Telephone Encounter (Signed)
Future labs placed. 

## 2016-01-23 NOTE — Assessment & Plan Note (Signed)
Will get cbc, cmp, tsh, lipid panel and UA.

## 2016-01-23 NOTE — Telephone Encounter (Signed)
-----   Message from Bunnie Domino, LPN sent at [id number]  3:07 PM EDT -----   ----- Message -----    From: Mackie Pai, PA-C    Sent: 01/23/2016   1:26 PM      To: Beatris Ship Mabe, CMA  Pt xray of lumbar spine showed mild degenerative changes. Back pain likely muscular and from degenerative changes. Excess weight around his waist  contributing to back pain to some degree. If can loose some weight this may help. I could send him in diclofenac. He could use this instead of ibuprofen.(not to use both since in same class of med)

## 2016-01-23 NOTE — Progress Notes (Signed)
Subjective:    Patient ID: Glenn Mendoza, male    DOB: [birth year], 35 y.o.   MRN: [id number]  HPI  I have reviewed pt PMH, PSH, FH, Social History and Surgical History  Pt is fasting.   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 Sedentary work. Pt states eating moderate healthy. He is trying to limit salt intake. Pt drinks 2-3 cups of coffee a day. Married- 5 children. Wife is pregnant.  No family history of skin cancer.  Pt mentioned at end of exam that he has lower lumbar back pain.  In past chiropracter did xrays.  Pt is taking ibuprofen and not helping at all. Before would help. Pt states chiropracter did xray in the past. I had put order in the past but looks like he never got xray done.    Review of Systems  Constitutional: Negative for fever, chills and fatigue.  HENT: Negative for congestion and ear discharge.   Respiratory: Negative for cough, chest tightness, shortness of breath and wheezing.   Cardiovascular: Negative for chest pain and palpitations.  Gastrointestinal: Negative for abdominal pain.  Musculoskeletal: Negative for back pain.  Skin: Negative for rash.  Neurological: Negative for dizziness and headaches.  Hematological: Negative for adenopathy. Does not bruise/bleed easily.  Psychiatric/Behavioral: Negative for confusion, sleep disturbance, dysphoric mood, decreased concentration and agitation. The patient is not nervous/anxious.     Past Medical History  Diagnosis Date  . Acid reflux   . Depression   . PTSD (post-traumatic stress disorder)   . Allergy   . Anxiety      Social History   Social History  . Marital Status: Married    Spouse Name: N/A  . Number of Children: N/A  . Years of Education: N/A   Occupational History  . Not on file.   Social History Main Topics  . Smoking status: Current Some Day Smoker -- 0.50 packs/day for .2 years    Types: Cigarettes  . Smokeless tobacco: Not on file  . Alcohol Use: No  . Drug  Use: Not on file  . Sexual Activity: Yes   Other Topics Concern  . Not on file   Social History Narrative    History reviewed. No pertinent past surgical history.  Family History  Problem Relation Age of Onset  . Depression Mother   . Diabetes Mother   . Alcohol abuse Father   . Hypertension Father   . Alcohol abuse Sister   . Alcohol abuse Brother     Allergies  Allergen Reactions  . Wellbutrin [Bupropion] Hives    On palms and soles  . Codeine Itching and Anxiety    Current Outpatient Prescriptions on File Prior to Visit  Medication Sig Dispense Refill  . omeprazole (PRILOSEC) 20 MG capsule      No current facility-administered medications on file prior to visit.    BP 118/90 mmHg  Pulse 84  Temp(Src) 98 F (36.7 C) (Oral)  Ht 5\' 11"  (1.803 m)  Wt 300 lb 3.2 oz (136.17 kg)  BMI 41.89 kg/m2  SpO2 97%       Objective:   Physical Exam  General Mental Status- Alert. General Appearance- Not in acute distress.   Neck Carotid Arteries- Normal color. Moisture- Normal Moisture. No carotid bruits. No JVD.  Chest and Lung Exam Auscultation: Breath Sounds:-Normal.  Cardiovascular Auscultation:Rythm- Regular. Murmurs & Other Heart Sounds:Auscultation of the heart reveals- No Murmurs.  Abdomen Inspection:-Inspeection Normal. Palpation/Percussion:Note:No mass. Palpation and Percussion  of the abdomen reveal- Non Tender, Non Distended + BS, no rebound or guarding.   Neurologic Cranial Nerve exam:- CN III-XII intact(No nystagmus), symmetric smile. Strength:- 5/5 equal and symmetric strength both upper and lower extremities.  Skin- scattered small moles. But one large about 8mm(pt unaware of size of features)      Assessment & Plan:  Wellness examination Will get cbc, cmp, tsh, lipid panel and UA.  (hiv screening)  Recommend diet and exercise. Try to loose some weight. This will be beneficial in the long run.  Will get xrays of lumbar spine  today.  For your large mole on your back I think will go ahead and refer to dermatologist. The size is one feature of concern. No baseline for comparison?

## 2016-01-23 NOTE — Addendum Note (Signed)
Addended by: Harl Bowie on: 01/23/2016 09:45 AM   Modules accepted: Orders

## 2016-01-24 NOTE — Telephone Encounter (Signed)
Opened to review. Looks like rx already sent in.

## 2016-01-24 NOTE — Progress Notes (Signed)
Quick Note:  Pt has seen results on MyChart and message also sent for patient to call back if any questions. ______ 

## 2016-02-13 DIAGNOSIS — D225 Melanocytic nevi of trunk: Secondary | ICD-10-CM | POA: Diagnosis not present

## 2016-02-13 DIAGNOSIS — D485 Neoplasm of uncertain behavior of skin: Secondary | ICD-10-CM | POA: Diagnosis not present

## 2016-02-13 DIAGNOSIS — D235 Other benign neoplasm of skin of trunk: Secondary | ICD-10-CM | POA: Diagnosis not present

## 2016-03-01 ENCOUNTER — Other Ambulatory Visit: Payer: Self-pay | Admitting: Medical

## 2016-03-01 MED FILL — DICLOFENAC SOD EC 75 MG TAB: 75 | 15 days supply | Qty: 30 | Fill #0

## 2016-03-01 MED FILL — OMEPRAZOLE DR 20 MG CAPSULE: 20 | 30 days supply | Qty: 30 | Fill #1

## 2016-03-16 DIAGNOSIS — H40053 Ocular hypertension, bilateral: Secondary | ICD-10-CM | POA: Diagnosis not present

## 2016-03-23 MED FILL — OMEPRAZOLE DR 20 MG CAPSULE: 20 | 30 days supply | Qty: 30 | Fill #2

## 2016-03-24 ENCOUNTER — Ambulatory Visit (INDEPENDENT_AMBULATORY_CARE_PROVIDER_SITE_OTHER): Payer: 59 | Admitting: Physician Assistant

## 2016-03-24 VITALS — BP 124/80 | HR 111 | Temp 98.6°F | Resp 18 | Ht 71.0 in | Wt 298.8 lb

## 2016-03-24 DIAGNOSIS — L02419 Cutaneous abscess of limb, unspecified: Secondary | ICD-10-CM | POA: Diagnosis not present

## 2016-03-24 MED ORDER — DOXYCYCLINE HYCLATE 100 MG PO CAPS: 100.0000 mg | ORAL_CAPSULE | Freq: Two times a day (BID) | ORAL | 0 refills | Status: AC

## 2016-03-24 NOTE — Addendum Note (Signed)
Addended by: Tereasa Coop on: 03/24/2016 02:03 PM   Modules accepted: Orders

## 2016-03-24 NOTE — Progress Notes (Signed)
   03/24/2016 2:00 PM   DOB: [birth year] / MRN: [id number]  SUBJECTIVE:  Glenn Mendoza is a 35 y.o. male presenting for a right axillary mass that has become exquisitely painful over the last week.  He wife is a Marine scientist and told him he needed to be seen.    He is allergic to wellbutrin [bupropion] and codeine.   He  has a past medical history of Acid reflux; Allergy; Anxiety; Depression; and PTSD (post-traumatic stress disorder).    He  reports that he has been smoking Cigarettes.  He has a 0.10 pack-year smoking history. He does not have any smokeless tobacco history on file. He reports that he does not drink alcohol. He  reports that he currently engages in sexual activity. The patient  has no past surgical history on file.  His family history includes Alcohol abuse in his brother, father, and sister; Depression in his mother; Diabetes in his mother; Hypertension in his father.  Review of Systems  Constitutional: Negative for chills and fever.  Gastrointestinal: Negative for nausea.  Genitourinary: Negative for dysuria.  Musculoskeletal: Negative for myalgias.  Skin: Negative for itching and rash.  Neurological: Negative for dizziness.    The problem list and medications were reviewed and updated by myself where necessary and exist elsewhere in the encounter.   OBJECTIVE:  BP 124/80   Pulse (!) 111   Temp 98.6 F (37 C) (Oral)   Resp 18   Ht 5\' 11"  (1.803 m)   Wt 298 lb 12.8 oz (135.5 kg)   SpO2 96%   BMI 41.67 kg/m   Physical Exam  Cardiovascular: Normal rate and regular rhythm.   Rate 94 on auscultation  Pulmonary/Chest: Effort normal and breath sounds normal.  Skin:      Risk and benefits discussed and verbal consent obtained. Anesthetic allergies reviewed. Patient anesthetized using 1:1 mix of 2% lidocaine with epi. A 1 cm incision was made using a number 11 blade and purulent material was expressed.  The was wound packed. The patient tolerated the procedure without  difficulty.   A clean dressing was placed and wound care instructions were provided.    No results found for this or any previous visit (from the past 72 hour(s)).  No results found.  ASSESSMENT AND PLAN  [name] was seen today for cyst.  Diagnoses and all orders for this visit:  Axillary abscess: Drained a packed.  Culture pending.  Will start doxy pending culture.   -     doxycycline (VIBRAMYCIN) 100 MG capsule; Take 1 capsule (100 mg total) by mouth 2 (two) times daily.    The patient is advised to call or return to clinic if he does not see an improvement in symptoms, or to seek the care of the closest emergency department if he worsens with the above plan.   Philis Fendt, MHS, PA-C [facility] 03/24/2016 2:00 PM

## 2016-03-24 NOTE — Patient Instructions (Signed)
     IF you received an x-ray today, you will receive an invoice from [facility]. Please contact [facility] at [phone] with questions or concerns regarding your invoice.   IF you received labwork today, you will receive an invoice from [facility]. Please contact [facility] at [phone] with questions or concerns regarding your invoice.   Our billing staff will not be able to assist you with questions regarding bills from these companies.  You will be contacted with the lab results as soon as they are available. The fastest way to get your results is to activate your My Chart account. Instructions are located on the last page of this paperwork. If you have not heard from us regarding the results in 2 weeks, please contact this office.      

## 2016-03-26 ENCOUNTER — Ambulatory Visit (INDEPENDENT_AMBULATORY_CARE_PROVIDER_SITE_OTHER): Payer: 59 | Admitting: Urgent Care

## 2016-03-26 VITALS — BP 130/84 | HR 76 | Temp 98.3°F | Resp 17 | Ht 71.0 in | Wt 307.0 lb

## 2016-03-26 DIAGNOSIS — Z5189 Encounter for other specified aftercare: Secondary | ICD-10-CM

## 2016-03-26 DIAGNOSIS — L02419 Cutaneous abscess of limb, unspecified: Secondary | ICD-10-CM

## 2016-03-26 NOTE — Patient Instructions (Addendum)
Each day, have your wife pull back 1/2-1cm of the packing and secure it back to your skin with tape. Use non-adherent dressing. Return to the clinic on Friday for a recheck.    IF you received an x-ray today, you will receive an invoice from [facility]. Please contact [facility] at [phone] with questions or concerns regarding your invoice.   IF you received labwork today, you will receive an invoice from [facility]. Please contact [facility] at [phone] with questions or concerns regarding your invoice.   Our billing staff will not be able to assist you with questions regarding bills from these companies.  You will be contacted with the lab results as soon as they are available. The fastest way to get your results is to activate your My Chart account. Instructions are located on the last page of this paperwork. If you have not heard from [place] regarding the results in 2 weeks, please contact this office.

## 2016-03-26 NOTE — Progress Notes (Signed)
    MRN: [id number] DOB: 10/07/1980  Subjective:   Glenn Mendoza is a 36 y.o. male presenting for follow up on axillary abscess.   Reports significant improvement. He is taking antibiotic and has had one dressing change at home. Denies fever, worsening pain, spontaneous drainage or bleeding.  Glenn Mendoza has a current medication list which includes the following prescription(s): diclofenac, doxycycline, and omeprazole. Also is allergic to wellbutrin [bupropion] and codeine.  Glenn Mendoza  has a past medical history of Acid reflux; Allergy; Anxiety; Depression; and PTSD (post-traumatic stress disorder). Also  has no past surgical history on file.  Objective:   Vitals: BP 130/84 (BP Location: Right Arm, Patient Position: Sitting, Cuff Size: Normal)   Pulse 76   Temp 98.3 F (36.8 C) (Oral)   Resp 17   Ht 5\' 11"  (1.803 m)   Wt (!) 307 lb (139.3 kg)   SpO2 96%   BMI 42.82 kg/m   Physical Exam  Skin:       WOUND CARE: Dressing and packing removed with moderate amount of purulent material present. Minimal drainage expressed. Wound irrigated with ~20cc of sterile water. Repacked with 1/4" packing. Cleansed and dressed.  Assessment and Plan :   1. Encounter for wound care 2. Axillary abscess - Patient's wife is a Marine scientist, states that he cannot come back in 2 days. I instructed him on how to many his wound care at home and he will return to clinic on 03/30/2016. He is to withdraw the packing ~1cm each day and change dressings daily. RTC sooner if wound worsens.  Jaynee Eagles, PA-C [facility] [phone] 03/26/2016 6:16 PM

## 2016-03-27 LAB — WOUND CULTURE
GRAM STAIN: NONE SEEN
GRAM STAIN: NONE SEEN
Gram Stain: NONE SEEN

## 2016-04-30 ENCOUNTER — Telehealth: Payer: Self-pay

## 2016-04-30 MED FILL — OMEPRAZOLE DR 20 MG CAPSULE: 20 | 30 days supply | Qty: 30 | Fill #3

## 2016-04-30 NOTE — Telephone Encounter (Signed)
Called patient to see what he needed Tramadol for. This med not on his med list. Unable to fill at this time. Request sent to [facility] for review,

## 2016-04-30 NOTE — Telephone Encounter (Signed)
I reviewed his chart. I did not see tramadol on his prior medication list either. He has history of some back pain. Let him know tramadol is a controlled medication and because of that he would need a visit and documentation to go along with before any prescription of controlled med could be written. Since not on prior med list I think someone else wrote him the rx.

## 2016-05-02 NOTE — Telephone Encounter (Signed)
Patient states this was a medication that was prescribes to him a long time ago. Advised he would need to come in for office visit for new Rx. States he will continue to take his Ibuprophen and schedule an appointment to come in if needed.

## 2016-06-04 MED FILL — OMEPRAZOLE DR 20 MG CAPSULE: 20 | 30 days supply | Qty: 30 | Fill #4

## 2016-07-02 MED FILL — OMEPRAZOLE DR 20 MG CAPSULE: 20 | 30 days supply | Qty: 30 | Fill #5

## 2016-07-31 ENCOUNTER — Other Ambulatory Visit: Payer: Self-pay | Admitting: Medical

## 2016-07-31 MED FILL — OMEPRAZOLE DR 20 MG CAPSULE: 20 | 30 days supply | Qty: 30 | Fill #0

## 2016-09-04 MED FILL — OMEPRAZOLE DR 20 MG CAPSULE: 20 | 30 days supply | Qty: 30 | Fill #1

## 2017-01-08 DIAGNOSIS — Z3009 Encounter for other general counseling and advice on contraception: Secondary | ICD-10-CM | POA: Diagnosis not present

## 2017-01-09 MED FILL — diazePAM 10 MG TABS: 10 | 1 days supply | Qty: 2 | Fill #0

## 2017-01-24 ENCOUNTER — Encounter: Payer: Self-pay | Admitting: Medical

## 2017-02-11 ENCOUNTER — Encounter: Payer: Self-pay | Admitting: Medical

## 2017-02-14 ENCOUNTER — Encounter: Payer: Self-pay | Admitting: Medical

## 2017-02-14 ENCOUNTER — Ambulatory Visit (INDEPENDENT_AMBULATORY_CARE_PROVIDER_SITE_OTHER): Payer: 59 | Admitting: Medical

## 2017-02-14 ENCOUNTER — Telehealth: Payer: Self-pay | Admitting: Medical

## 2017-02-14 VITALS — BP 122/78 | HR 68 | Temp 98.1°F | Resp 16 | Ht 71.0 in | Wt 242.6 lb

## 2017-02-14 DIAGNOSIS — Z0000 Encounter for general adult medical examination without abnormal findings: Secondary | ICD-10-CM

## 2017-02-14 LAB — CBC WITH DIFFERENTIAL/PLATELET
Basophils Absolute: 0 10*3/uL (ref 0.0–0.1)
Basophils Relative: 0.7 % (ref 0.0–3.0)
EOS PCT: 2 % (ref 0.0–5.0)
Eosinophils Absolute: 0.1 10*3/uL (ref 0.0–0.7)
HEMATOCRIT: 49.5 % (ref 39.0–52.0)
Hemoglobin: 17.1 g/dL — ABNORMAL HIGH (ref 13.0–17.0)
LYMPHS ABS: 2.3 10*3/uL (ref 0.7–4.0)
Lymphocytes Relative: 36.6 % (ref 12.0–46.0)
MCHC: 34.5 g/dL (ref 30.0–36.0)
MCV: 88.4 fl (ref 78.0–100.0)
MONOS PCT: 6.8 % (ref 3.0–12.0)
Monocytes Absolute: 0.4 10*3/uL (ref 0.1–1.0)
NEUTROS ABS: 3.5 10*3/uL (ref 1.4–7.7)
NEUTROS PCT: 53.9 % (ref 43.0–77.0)
PLATELETS: 236 10*3/uL (ref 150.0–400.0)
RBC: 5.6 Mil/uL (ref 4.22–5.81)
RDW: 13.2 % (ref 11.5–15.5)
WBC: 6.4 10*3/uL (ref 4.0–10.5)

## 2017-02-14 LAB — LIPID PANEL
Cholesterol: 168 mg/dL (ref 0–200)
HDL: 31.6 mg/dL — AB (ref >39.00)
LDL Cholesterol: 109 mg/dL — ABNORMAL HIGH (ref 0–99)
NONHDL: 136.87
TRIGLYCERIDES: 138 mg/dL (ref 0.0–149.0)
Total CHOL/HDL Ratio: 5
VLDL: 27.6 mg/dL (ref 0.0–40.0)

## 2017-02-14 LAB — COMPREHENSIVE METABOLIC PANEL
ALK PHOS: 82 U/L (ref 39–117)
ALT: 21 U/L (ref 0–53)
AST: 16 U/L (ref 0–37)
Albumin: 4.7 g/dL (ref 3.5–5.2)
BUN: 15 mg/dL (ref 6–23)
CALCIUM: 10 mg/dL (ref 8.4–10.5)
CO2: 27 meq/L (ref 19–32)
Chloride: 101 mEq/L (ref 96–112)
Creatinine, Ser: 0.95 mg/dL (ref 0.40–1.50)
GFR: 96 mL/min (ref >60.00)
GLUCOSE: 100 mg/dL — AB (ref 70–99)
POTASSIUM: 4.2 meq/L (ref 3.5–5.1)
Sodium: 136 mEq/L (ref 135–145)
TOTAL PROTEIN: 7.9 g/dL (ref 6.0–8.3)
Total Bilirubin: 0.6 mg/dL (ref 0.2–1.2)

## 2017-02-14 NOTE — Progress Notes (Signed)
Subjective:    Patient ID: Glenn Mendoza, male    DOB: 02-13-1981, 36 y.o.   MRN: [id number]  HPI   For in for a physical. He is fasting today for exam.  Pt has been dieting to lose weight. He has lost about 90 lbs in 6 months with modified ketogenic diet. Pt has 81 yr old child. Born since I last saw him.  Pt has not been working out. No sodas or sweet teas. Not smoking. Former vaper but stopped.  Pt is up to date on vaccine   Review of Systems  Constitutional: Negative for chills, fatigue and fever.  HENT: Negative for congestion, ear pain, mouth sores, postnasal drip, sinus pain, sinus pressure and sneezing.   Respiratory: Negative for cough, chest tightness, shortness of breath and wheezing.   Cardiovascular: Negative for chest pain and palpitations.  Gastrointestinal: Negative for abdominal distention, abdominal pain, constipation, diarrhea, nausea and vomiting.       Former reflux is controlled.  Genitourinary: Negative for dysuria, flank pain, frequency and urgency.  Musculoskeletal: Positive for back pain. Negative for arthralgias, gait problem, joint swelling and neck pain.       Mild occasional lower back pain on and off again. Not present currently. Really states minimal with no worrisome features.  Skin: Negative for rash.  Neurological: Negative for dizziness, seizures, weakness and headaches.  Hematological: Negative for adenopathy. Does not bruise/bleed easily.  Psychiatric/Behavioral: Negative for agitation, decreased concentration, dysphoric mood and suicidal ideas. The patient is not nervous/anxious.        Some mild stress related to work.   Past Medical History:  Diagnosis Date  . Acid reflux   . Allergy   . Anxiety   . Depression   . PTSD (post-traumatic stress disorder)      Social History   Social History  . Marital status: Married    Spouse name: N/A  . Number of children: N/A  . Years of education: N/A   Occupational History  . Not on file.     Social History Main Topics  . Smoking status: Former Smoker    Packs/day: 0.50    Years: 0.20    Types: Cigarettes  . Smokeless tobacco: Never Used  . Alcohol use No  . Drug use: Unknown  . Sexual activity: Yes   Other Topics Concern  . Not on file   Social History Narrative  . No narrative on file    History reviewed. No pertinent surgical history.  Family History  Problem Relation Age of Onset  . Depression Mother   . Diabetes Mother   . Alcohol abuse Father   . Hypertension Father   . Alcohol abuse Sister   . Alcohol abuse Brother     Allergies  Allergen Reactions  . Wellbutrin [Bupropion] Hives    On palms and soles  . Codeine Itching and Anxiety    No current outpatient prescriptions on file prior to visit.   No current facility-administered medications on file prior to visit.     BP 122/78 (BP Location: Right Arm, Patient Position: Sitting, Cuff Size: Large)   Pulse 68   Temp 98.1 F (36.7 C) (Oral)   Resp 16   Ht 5\' 11"  (1.803 m)   Wt 242 lb 9.6 oz (110 kg)   SpO2 99%   BMI 33.84 kg/m       Objective:   Physical Exam  General Mental Status- Alert. General Appearance- Not in acute distress.  Skin Rashes- No Rashes. No suspcious lesions  HEENT Head- Normal. Ear Auditory Canal - Left- Normal. Right - Normal.Tympanic Membrane- Left- Normal. Right- Normal. Eye Sclera/Conjunctiva- Left- Normal. Right- Normal. Nose & Sinuses Nasal Mucosa- Left-  Boggy and Congested. Right-  Boggy and  Congested.Bilateral  No maxillary and no  frontal sinus pressure. Mouth & Throat Lips: Upper Lip- Normal: no dryness, cracking, pallor, cyanosis, or vesicular eruption. Lower Lip-Normal: no dryness, cracking, pallor, cyanosis or vesicular eruption. Buccal Mucosa- Bilateral- No Aphthous ulcers. Oropharynx- No Discharge or Erythema. Tonsils: Characteristics- Bilateral- No Erythema or Congestion. Size/Enlargement- Bilateral- No enlargement. Discharge-  bilateral-None.  Neck Neck- Supple. No Masses.   Chest and Lung Exam Auscultation: Breath Sounds:-Clear even and unlabored.  Cardiovascular Auscultation:Rythm- Regular, rate and rhythm. Murmurs & Other Heart Sounds:Ausculatation of the heart reveal- No Murmurs.  Lymphatic Head & Neck General Head & Neck Lymphatics: Bilateral: Description- No Localized lymphadenopathy.    Abdomen Inspection:-Inspeection Normal. Palpation/Percussion:Note:No mass. Palpation and Percussion of the abdomen reveal- Non Tender, Non Distended + BS, no rebound or guarding.  Genital- no hernia, normal testicles.   Neurologic Cranial Nerve exam:- CN III-XII intact(No nystagmus), symmetric smile. Strength:- 5/5 equal and symmetric strength both upper and lower extremities.      Assessment & Plan:  For you wellness exam today I have ordered cbc, cmp, lipid panel, and ua.  Vaccine up to date.  Recommend exercise and healthy diet.  We will let you know lab results as they come in.  Good job on your weight loss.   Follow up date appointment will be determined after lab review.   Naeema Patlan, Percell Miller, PA-C

## 2017-02-14 NOTE — Telephone Encounter (Signed)
ua poct added for wellness exam

## 2017-02-14 NOTE — Patient Instructions (Addendum)
For you wellness exam today I have ordered cbc, cmp, lipid panel, and ua.  Vaccine up to date.  Recommend exercise and healthy diet.  We will let you know lab results as they come in.  Good job on your weight loss.   Follow up date appointment will be determined after lab review.    Preventive Care 18-39 Years, Male Preventive care refers to lifestyle choices and visits with your health care provider that can promote health and wellness. What does preventive care include?  A yearly physical exam. This is also called an annual well check.  Dental exams once or twice a year.  Routine eye exams. Ask your health care provider how often you should have your eyes checked.  Personal lifestyle choices, including: ? Daily care of your teeth and gums. ? Regular physical activity. ? Eating a healthy diet. ? Avoiding tobacco and drug use. ? Limiting alcohol use. ? Practicing safe sex. What happens during an annual well check? The services and screenings done by your health care provider during your annual well check will depend on your age, overall health, lifestyle risk factors, and family history of disease. Counseling Your health care provider may ask you questions about your:  Alcohol use.  Tobacco use.  Drug use.  Emotional well-being.  Home and relationship well-being.  Sexual activity.  Eating habits.  Work and work Statistician.  Screening You may have the following tests or measurements:  Height, weight, and BMI.  Blood pressure.  Lipid and cholesterol levels. These may be checked every 5 years starting at age 21.  Diabetes screening. This is done by checking your blood sugar (glucose) after you have not eaten for a while (fasting).  Skin check.  Hepatitis C blood test.  Hepatitis B blood test.  Sexually transmitted disease (STD) testing.  Discuss your test results, treatment options, and if necessary, the need for more tests with your health care  provider. Vaccines Your health care provider may recommend certain vaccines, such as:  Influenza vaccine. This is recommended every year.  Tetanus, diphtheria, and acellular pertussis (Tdap, Td) vaccine. You may need a Td booster every 10 years.  Varicella vaccine. You may need this if you have not been vaccinated.  HPV vaccine. If you are 12 or younger, you may need three doses over 6 months.  Measles, mumps, and rubella (MMR) vaccine. You may need at least one dose of MMR.You may also need a second dose.  Pneumococcal 13-valent conjugate (PCV13) vaccine. You may need this if you have certain conditions and have not been vaccinated.  Pneumococcal polysaccharide (PPSV23) vaccine. You may need one or two doses if you smoke cigarettes or if you have certain conditions.  Meningococcal vaccine. One dose is recommended if you are age 53-21 years and a first-year college student living in a residence hall, or if you have one of several medical conditions. You may also need additional booster doses.  Hepatitis A vaccine. You may need this if you have certain conditions or if you travel or work in places where you may be exposed to hepatitis A.  Hepatitis B vaccine. You may need this if you have certain conditions or if you travel or work in places where you may be exposed to hepatitis B.  Haemophilus influenzae type b (Hib) vaccine. You may need this if you have certain risk factors.  Talk to your health care provider about which screenings and vaccines you need and how often you need them. This information  is not intended to replace advice given to you by your health care provider. Make sure you discuss any questions you have with your health care provider. Document Released: 10/02/2001 Document Revised: 04/25/2016 Document Reviewed: 06/07/2015 Elsevier Interactive Patient Education  2017 [employer].

## 2017-03-09 DIAGNOSIS — H5213 Myopia, bilateral: Secondary | ICD-10-CM | POA: Diagnosis not present

## 2017-03-09 DIAGNOSIS — H52223 Regular astigmatism, bilateral: Secondary | ICD-10-CM | POA: Diagnosis not present

## 2017-03-21 DIAGNOSIS — Z302 Encounter for sterilization: Secondary | ICD-10-CM | POA: Diagnosis not present

## 2017-05-07 DIAGNOSIS — L732 Hidradenitis suppurativa: Secondary | ICD-10-CM | POA: Diagnosis not present

## 2017-05-07 MED FILL — DOXYCYCLINE HYCLATE 50 MG C: 50 | 30 days supply | Qty: 30 | Fill #0

## 2017-05-07 MED FILL — CLINDAMYCIN PH 1% GEL: 1 | 20 days supply | Qty: 30 | Fill #0

## 2017-06-19 DIAGNOSIS — L732 Hidradenitis suppurativa: Secondary | ICD-10-CM | POA: Diagnosis not present

## 2017-11-26 ENCOUNTER — Telehealth: Payer: 59 | Admitting: Family

## 2017-11-26 DIAGNOSIS — L237 Allergic contact dermatitis due to plants, except food: Secondary | ICD-10-CM | POA: Diagnosis not present

## 2017-11-26 MED ORDER — PREDNISONE 20 MG PO TABS: 20.0000 mg | ORAL_TABLET | Freq: Every day | ORAL | 0 refills | Status: DC

## 2017-11-26 MED FILL — predniSONE 20 MG TABS: 20 | 6 days supply | Qty: 6 | Fill #0

## 2017-11-26 NOTE — Progress Notes (Signed)
E Visit for Rash  We are sorry that you are not feeling well. Here is how we plan to help!  Based on what you shared with me it looks like you have contact dermatitis.  Contact dermatitis is a skin rash caused by something that touches the skin and causes irritation or inflammation.  Your skin may be red, swollen, dry, cracked, and itch.  The rash should go away in a few days but can last a few weeks.  If you get a rash, it's important to figure out what caused it so the irritant can be avoided in the future.   Prednisone 20 mg daily for 6 days   HOME CARE:   Take cool showers and avoid direct sunlight.  Apply cool compress or wet dressings.  Take a bath in an oatmeal bath.  Sprinkle content of one Aveeno packet under running faucet with comfortably warm water.  Bathe for 15-20 minutes, 1-2 times daily.  Pat dry with a towel. Do not rub the rash.  Use hydrocortisone cream.  Take an antihistamine like Benadryl for widespread rashes that itch.  The adult dose of Benadryl is 25-50 mg by mouth 4 times daily.  Caution:  This type of medication may cause sleepiness.  Do not drink alcohol, drive, or operate dangerous machinery while taking antihistamines.  Do not take these medications if you have prostate enlargement.  Read package instructions thoroughly on all medications that you take.  GET HELP RIGHT AWAY IF:   Symptoms don't go away after treatment.  Severe itching that persists.  If you rash spreads or swells.  If you rash begins to smell.  If it blisters and opens or develops a yellow-brown crust.  You develop a fever.  You have a sore throat.  You become short of breath.  MAKE SURE YOU:  Understand these instructions. Will watch your condition. Will get help right away if you are not doing well or get worse.  Thank you for choosing an e-visit. Your e-visit answers were reviewed by a board certified advanced clinical practitioner to complete your personal care  plan. Depending upon the condition, your plan could have included both over the counter or prescription medications. Please review your pharmacy choice. Be sure that the pharmacy you have chosen is open so that you can pick up your prescription now.  If there is a problem you may message your provider in [place] to have the prescription routed to another pharmacy. Your safety is important to [place]. If you have drug allergies check your prescription carefully.  For the next 24 hours, you can use MyChart to ask questions about today's visit, request a non-urgent call back, or ask for a work or school excuse from your e-visit provider. You will get an email in the next two days asking about your experience. I hope that your e-visit has been valuable and will speed your recovery.

## 2018-04-18 ENCOUNTER — Encounter: Payer: Self-pay | Admitting: Medical

## 2018-04-18 ENCOUNTER — Telehealth: Payer: 59 | Admitting: Family

## 2018-04-18 DIAGNOSIS — M549 Dorsalgia, unspecified: Secondary | ICD-10-CM

## 2018-04-18 MED ORDER — BACLOFEN 10 MG PO TABS
10.0000 mg | ORAL_TABLET | Freq: Three times a day (TID) | ORAL | 0 refills | Status: DC
Start: 2018-04-18 — End: 2018-05-09

## 2018-04-18 MED ORDER — NAPROXEN 500 MG PO TABS: 500.0000 mg | ORAL_TABLET | Freq: Two times a day (BID) | ORAL | 0 refills | Status: DC

## 2018-04-18 MED FILL — BACLOFEN 10 MG TABLET: 10 | 10 days supply | Qty: 30 | Fill #0

## 2018-04-18 MED FILL — NAPROXEN 500 MG TABLET: 500 | 15 days supply | Qty: 30 | Fill #0

## 2018-04-18 NOTE — Progress Notes (Signed)

## 2018-04-30 IMAGING — DX DG LUMBAR SPINE 2-3V
3 series · 3 of 3 positions shown · non-contrast
Comparison: None.

CLINICAL DATA: Low back pain for years.  No known specific injury.

EXAM:
LUMBAR SPINE - 2-3 VIEW

[l-spine ap]
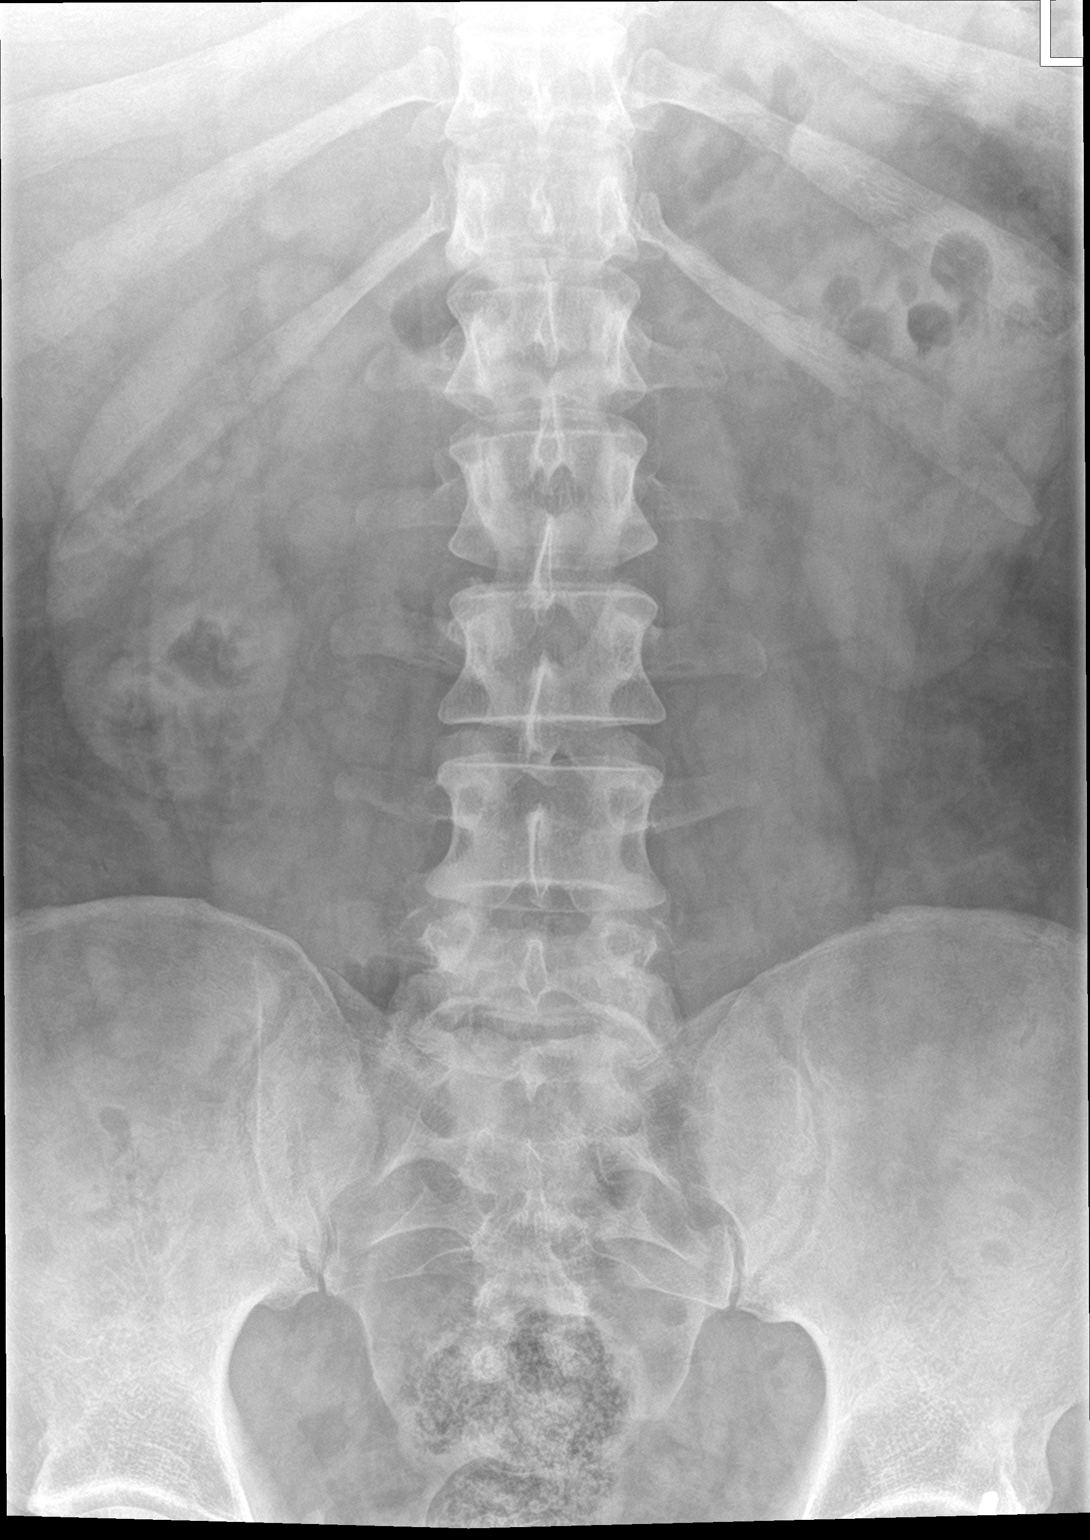

[l-spine lat]
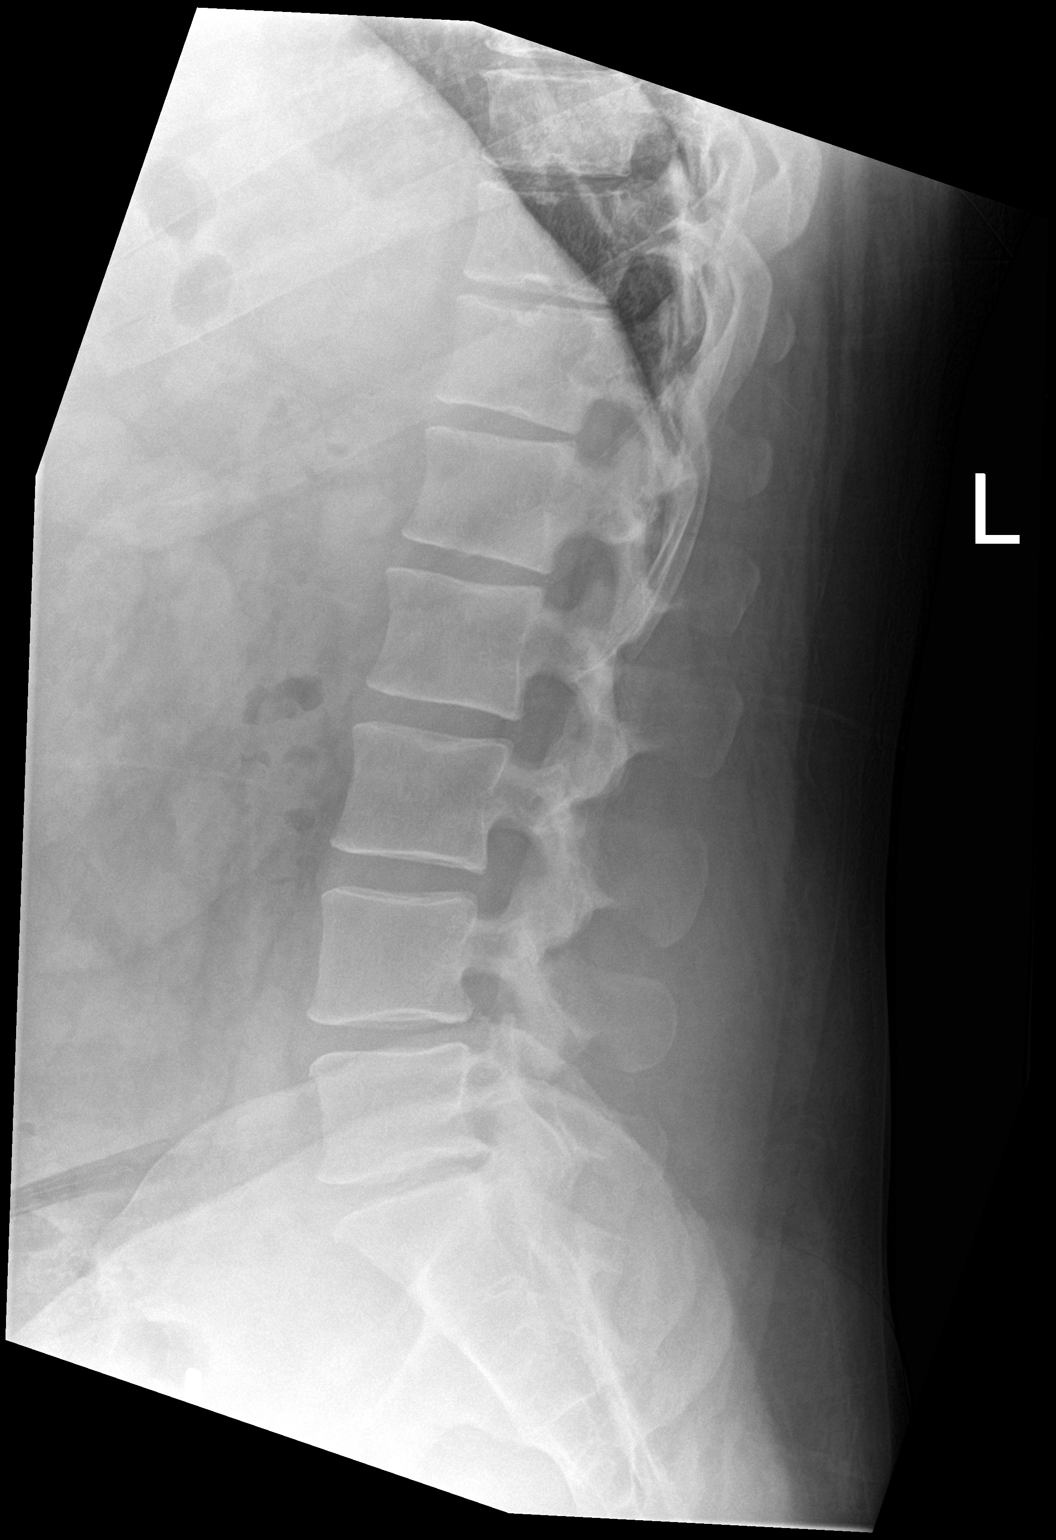

[l-spine spot]
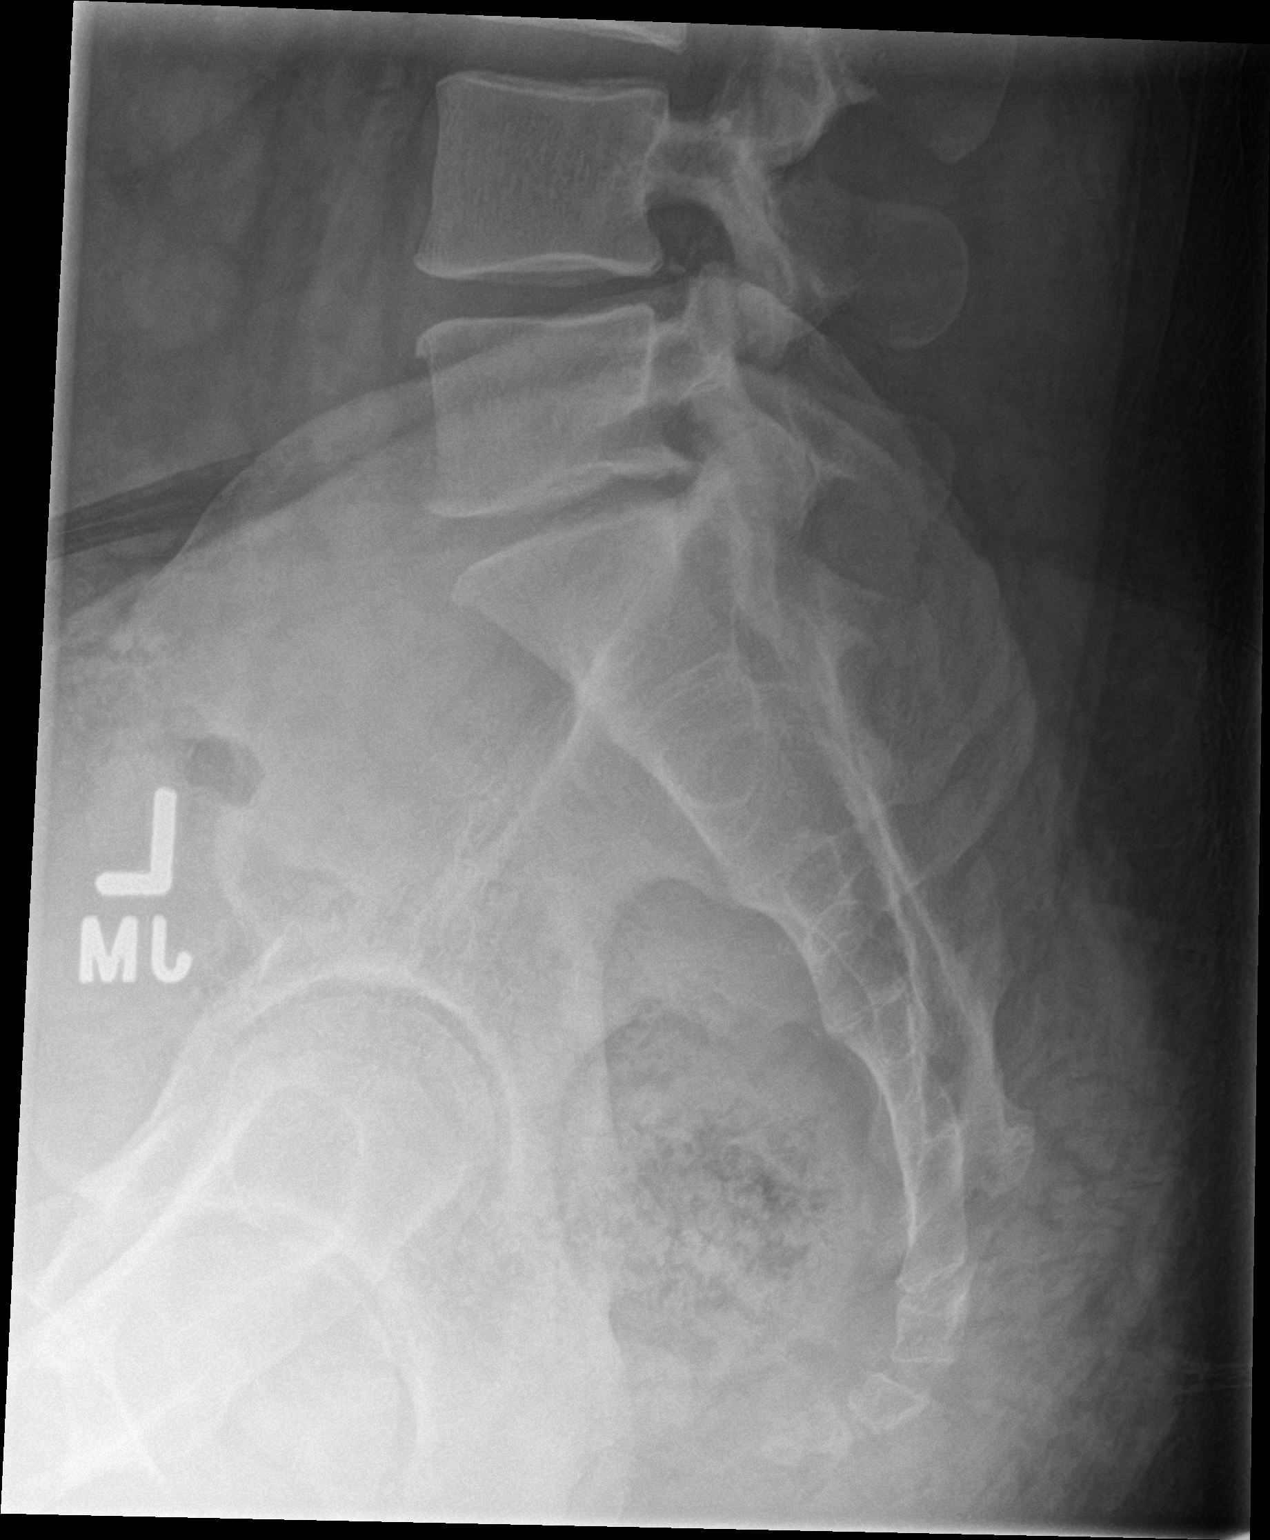

[3 of 3 positions shown; findings below may reference images not displayed]

FINDINGS: Alignment of the lumbar spine is normal. Bone mineralization is
normal. No fracture line or displaced fracture fragment. No acute or
suspicious osseous lesion. Upper sacrum appears intact and normal in
mineralization. Paravertebral soft tissues are unremarkable.

Mild disc desiccation is seen at the L5-S1 level with mild disc
space narrowing and minimal associated osseous spurring.
IMPRESSION: 1. No acute findings.
2. Mild degenerative change at the lumbosacral junction.

## 2018-05-09 ENCOUNTER — Encounter: Payer: Self-pay | Admitting: Medical

## 2018-05-09 ENCOUNTER — Ambulatory Visit (INDEPENDENT_AMBULATORY_CARE_PROVIDER_SITE_OTHER): Payer: 59 | Admitting: Medical

## 2018-05-09 VITALS — BP 117/79 | HR 56 | Temp 98.2°F | Resp 16 | Ht 71.0 in | Wt 242.8 lb

## 2018-05-09 DIAGNOSIS — Z0000 Encounter for general adult medical examination without abnormal findings: Secondary | ICD-10-CM | POA: Diagnosis not present

## 2018-05-09 LAB — CBC WITH DIFFERENTIAL/PLATELET
BASOS PCT: 0.9 % (ref 0.0–3.0)
Basophils Absolute: 0.1 10*3/uL (ref 0.0–0.1)
EOS PCT: 2.6 % (ref 0.0–5.0)
Eosinophils Absolute: 0.2 10*3/uL (ref 0.0–0.7)
HEMATOCRIT: 47.6 % (ref 39.0–52.0)
Hemoglobin: 16.4 g/dL (ref 13.0–17.0)
LYMPHS PCT: 45.5 % (ref 12.0–46.0)
Lymphs Abs: 2.8 10*3/uL (ref 0.7–4.0)
MCHC: 34.4 g/dL (ref 30.0–36.0)
MCV: 88 fl (ref 78.0–100.0)
MONOS PCT: 6.7 % (ref 3.0–12.0)
Monocytes Absolute: 0.4 10*3/uL (ref 0.1–1.0)
Neutro Abs: 2.7 10*3/uL (ref 1.4–7.7)
Neutrophils Relative %: 44.3 % (ref 43.0–77.0)
Platelets: 212 10*3/uL (ref 150.0–400.0)
RBC: 5.4 Mil/uL (ref 4.22–5.81)
RDW: 13.7 % (ref 11.5–15.5)
WBC: 6.1 10*3/uL (ref 4.0–10.5)

## 2018-05-09 LAB — LIPID PANEL
CHOLESTEROL: 169 mg/dL (ref 0–200)
HDL: 38.9 mg/dL — ABNORMAL LOW (ref >39.00)
LDL Cholesterol: 100 mg/dL — ABNORMAL HIGH (ref 0–99)
NONHDL: 130.44
TRIGLYCERIDES: 154 mg/dL — AB (ref 0.0–149.0)
Total CHOL/HDL Ratio: 4
VLDL: 30.8 mg/dL (ref 0.0–40.0)

## 2018-05-09 LAB — POC URINALSYSI DIPSTICK (AUTOMATED)
Bilirubin, UA: NEGATIVE
Blood, UA: NEGATIVE
Glucose, UA: NEGATIVE
Ketones, UA: NEGATIVE
LEUKOCYTES UA: NEGATIVE
NITRITE UA: NEGATIVE
PH UA: 6 (ref 5.0–8.0)
PROTEIN UA: NEGATIVE
Spec Grav, UA: 1.015 (ref 1.010–1.025)
UROBILINOGEN UA: NEGATIVE U/dL — AB

## 2018-05-09 LAB — COMPREHENSIVE METABOLIC PANEL
ALK PHOS: 68 U/L (ref 39–117)
ALT: 25 U/L (ref 0–53)
AST: 15 U/L (ref 0–37)
Albumin: 4.6 g/dL (ref 3.5–5.2)
BUN: 13 mg/dL (ref 6–23)
CALCIUM: 9.8 mg/dL (ref 8.4–10.5)
CO2: 29 mEq/L (ref 19–32)
CREATININE: 0.82 mg/dL (ref 0.40–1.50)
Chloride: 102 mEq/L (ref 96–112)
GFR: 112.96 mL/min (ref >60.00)
Glucose, Bld: 86 mg/dL (ref 70–99)
POTASSIUM: 4.2 meq/L (ref 3.5–5.1)
Sodium: 136 mEq/L (ref 135–145)
TOTAL PROTEIN: 7.6 g/dL (ref 6.0–8.3)
Total Bilirubin: 0.7 mg/dL (ref 0.2–1.2)

## 2018-05-09 NOTE — Patient Instructions (Addendum)
For you wellness exam today I have ordered cbc, cmp, lipid panel, and ua  Vaccine up to date.  Recommend exercise and healthy diet.  We will let you know lab results as they come in.  Follow up date appointment will be determined after lab review.    Preventive Care 18-39 Years, Male Preventive care refers to lifestyle choices and visits with your health care provider that can promote health and wellness. What does preventive care include?  A yearly physical exam. This is also called an annual well check.  Dental exams once or twice a year.  Routine eye exams. Ask your health care provider how often you should have your eyes checked.  Personal lifestyle choices, including: ? Daily care of your teeth and gums. ? Regular physical activity. ? Eating a healthy diet. ? Avoiding tobacco and drug use. ? Limiting alcohol use. ? Practicing safe sex. What happens during an annual well check? The services and screenings done by your health care provider during your annual well check will depend on your age, overall health, lifestyle risk factors, and family history of disease. Counseling Your health care provider may ask you questions about your:  Alcohol use.  Tobacco use.  Drug use.  Emotional well-being.  Home and relationship well-being.  Sexual activity.  Eating habits.  Work and work environment.  Screening You may have the following tests or measurements:  Height, weight, and BMI.  Blood pressure.  Lipid and cholesterol levels. These may be checked every 5 years starting at age 20.  Diabetes screening. This is done by checking your blood sugar (glucose) after you have not eaten for a while (fasting).  Skin check.  Hepatitis C blood test.  Hepatitis B blood test.  Sexually transmitted disease (STD) testing.  Discuss your test results, treatment options, and if necessary, the need for more tests with your health care provider. Vaccines Your health care  provider may recommend certain vaccines, such as:  Influenza vaccine. This is recommended every year.  Tetanus, diphtheria, and acellular pertussis (Tdap, Td) vaccine. You may need a Td booster every 10 years.  Varicella vaccine. You may need this if you have not been vaccinated.  HPV vaccine. If you are 26 or younger, you may need three doses over 6 months.  Measles, mumps, and rubella (MMR) vaccine. You may need at least one dose of MMR.You may also need a second dose.  Pneumococcal 13-valent conjugate (PCV13) vaccine. You may need this if you have certain conditions and have not been vaccinated.  Pneumococcal polysaccharide (PPSV23) vaccine. You may need one or two doses if you smoke cigarettes or if you have certain conditions.  Meningococcal vaccine. One dose is recommended if you are age 19-21 years and a first-year college student living in a residence hall, or if you have one of several medical conditions. You may also need additional booster doses.  Hepatitis A vaccine. You may need this if you have certain conditions or if you travel or work in places where you may be exposed to hepatitis A.  Hepatitis B vaccine. You may need this if you have certain conditions or if you travel or work in places where you may be exposed to hepatitis B.  Haemophilus influenzae type b (Hib) vaccine. You may need this if you have certain risk factors.  Talk to your health care provider about which screenings and vaccines you need and how often you need them. This information is not intended to replace advice given to   you by your health care provider. Make sure you discuss any questions you have with your health care provider. Document Released: 10/02/2001 Document Revised: 04/25/2016 Document Reviewed: 06/07/2015 Elsevier Interactive Patient Education  [employer].

## 2018-05-09 NOTE — Progress Notes (Signed)
Subjective:    Patient ID: Glenn Mendoza, male    DOB: Dec 27, 1980, 37 y.o.   MRN: [id number]  HPI  Pt in for wellness exam.  For in for a physical. He is fasting today for exam.  Pt has been dieting to lose weight. He has lost about 90 lbs in past with modified ketogenic diet(maintained the weight loss). Pt has 57 yr old child. Born since I last saw him.  Pt has not been working out. No sodas or sweet teas. Not smoking. Former vaper but stopped.  Pt is up to date on vaccine    Review of Systems  Constitutional: Negative for chills, fatigue and fever.  HENT: Negative for congestion, ear discharge, hearing loss, postnasal drip, rhinorrhea, sinus pressure and sneezing.   Respiratory: Negative for cough, chest tightness, shortness of breath and wheezing.   Cardiovascular: Negative for chest pain and palpitations.  Gastrointestinal: Negative for abdominal pain.  Musculoskeletal: Negative for back pain and neck pain.  Skin: Negative for rash.  Hematological: Negative for adenopathy.  Psychiatric/Behavioral: Negative for behavioral problems, confusion and sleep disturbance. The patient is not nervous/anxious.     Past Medical History:  Diagnosis Date  . Acid reflux   . Allergy   . Anxiety   . Depression   . PTSD (post-traumatic stress disorder)      Social History   Socioeconomic History  . Marital status: Married    Spouse name: Not on file  . Number of children: Not on file  . Years of education: Not on file  . Highest education level: Not on file  Occupational History  . Not on file  Social Needs  . Financial resource strain: Not on file  . Food insecurity:    Worry: Not on file    Inability: Not on file  . Transportation needs:    Medical: Not on file    Non-medical: Not on file  Tobacco Use  . Smoking status: Former Smoker    Packs/day: 0.50    Years: 0.20    Pack years: 0.10    Types: Cigarettes  . Smokeless tobacco: Never Used  Substance and Sexual  Activity  . Alcohol use: No  . Drug use: Not on file  . Sexual activity: Yes  Lifestyle  . Physical activity:    Days per week: Not on file    Minutes per session: Not on file  . Stress: Not on file  Relationships  . Social connections:    Talks on phone: Not on file    Gets together: Not on file    Attends religious service: Not on file    Active member of club or organization: Not on file    Attends meetings of clubs or organizations: Not on file    Relationship status: Not on file  . Intimate partner violence:    Fear of current or ex partner: Not on file    Emotionally abused: Not on file    Physically abused: Not on file    Forced sexual activity: Not on file  Other Topics Concern  . Not on file  Social History Narrative  . Not on file    No past surgical history on file.  Family History  Problem Relation Age of Onset  . Depression Mother   . Diabetes Mother   . Alcohol abuse Father   . Hypertension Father   . Alcohol abuse Sister   . Alcohol abuse Brother     Allergies  Allergen  Reactions  . Wellbutrin [Bupropion] Hives    On palms and soles  . Codeine Itching and Anxiety    Current Outpatient Medications on File Prior to Visit  Medication Sig Dispense Refill  . baclofen (LIORESAL) 10 MG tablet Take 10 mg by mouth 3 (three) times daily.    . naproxen (NAPROSYN) 500 MG tablet Take 1 tablet (500 mg total) by mouth 2 (two) times daily with a meal. 30 tablet 0  . predniSONE (DELTASONE) 20 MG tablet Take 1 tablet (20 mg total) by mouth daily with breakfast. 6 tablet 0   No current facility-administered medications on file prior to visit.     BP 117/79   Pulse (!) 56   Temp 98.2 F (36.8 C) (Oral)   Resp 16   Ht 5\' 11"  (1.803 m)   Wt 242 lb 12.8 oz (110.1 kg)   SpO2 100%   BMI 33.86 kg/m       Objective:   Physical Exam  General Mental Status- Alert. General Appearance- Not in acute distress.   Skin General: Color- Normal Color. Moisture-  Normal Moisture. Scattered few moles on back. Non worrisome. Evaluated also by derm about one year ago for anxilary cyst so they surveyed his skin.  Neck Carotid Arteries- Normal color. Moisture- Normal Moisture. No carotid bruits. No JVD.  Chest and Lung Exam Auscultation: Breath Sounds:-Normal.  Cardiovascular Auscultation:Rythm- Regular. Murmurs & Other Heart Sounds:Auscultation of the heart reveals- No Murmurs.  Abdomen Inspection:-Inspeection Normal. Palpation/Percussion:Note:No mass. Palpation and Percussion of the abdomen reveal- Non Tender, Non Distended + BS, no rebound or guarding.    Neurologic Cranial Nerve exam:- CN III-XII intact(No nystagmus), symmetric smile. Strength:- 5/5 equal and symmetric strength both upper and lower extremities.  HEENT Head- Normal. Ear Auditory Canal - Left- Normal. Right - Normal.Tympanic Membrane- Left- Normal. Right- Normal. Eye Sclera/Conjunctiva- Left- Normal. Right- Normal. Nose & Sinuses Nasal Mucosa- Left-  Boggy and Congested. Right- not  Boggy and  Congested.Bilateral no  maxillary and  No frontal sinus pressure. Mouth & Throat Lips: Upper Lip- Normal: no dryness, cracking, pallor, cyanosis, or vesicular eruption. Lower Lip-Normal: no dryness, cracking, pallor, cyanosis or vesicular eruption. Buccal Mucosa- Bilateral- No Aphthous ulcers. Oropharynx- No Discharge or Erythema. Tonsils: Characteristics- Bilateral- No Erythema or Congestion. Size/Enlargement- Bilateral- No enlargement. Discharge- bilateral-None.           Assessment & Plan:  For you wellness exam today I have ordered cbc, cmp lipid panel, and ua.  Vaccine up to date.  Recommend exercise and healthy diet.  We will let you know lab results as they come in.  Follow up date appointment will be determined after lab review.   Mackie Pai, PA-C

## 2019-03-29 ENCOUNTER — Telehealth: Payer: 59 | Admitting: Family

## 2019-03-29 DIAGNOSIS — L255 Unspecified contact dermatitis due to plants, except food: Secondary | ICD-10-CM

## 2019-03-29 MED ORDER — PREDNISONE 10 MG (21) PO TBPK: ORAL_TABLET | ORAL | 0 refills | Status: DC

## 2019-03-29 NOTE — Progress Notes (Signed)

## 2019-05-10 NOTE — Progress Notes (Addendum)
Subjective:    Patient ID: Glenn Mendoza, male    DOB: [birth year], 38 y.o.   MRN: [id number]  HPI  Pt is schedule for CPE/wellness exam  Up to dat on vaccines except flu vaccine. Counseled on benefit of vaccine.  Last year pt had been dieting to lose weight. He has lost about 90 lbs in past with modified ketogenic diet(maintained the weight loss). He has kept off most of the wait. Child almost 2 yr old now. Since covid pt reports.   He states he does walk around neighbood twice a week. Pt has not been working out. No sodas or sweet teas. Not smoking. Former vaper but stopped.  Pt states he had been layed off at work. He is working part time. He works about 12 hours a week.   Review of Systems  Constitutional: Negative for chills, fatigue and fever.  HENT: Negative for congestion, ear discharge, ear pain, rhinorrhea, sinus pressure, sinus pain, sneezing and sore throat.   Respiratory: Negative for chest tightness, shortness of breath and wheezing.   Cardiovascular: Negative for chest pain and palpitations.  Gastrointestinal: Negative for abdominal pain.  Musculoskeletal: Negative for back pain.  Neurological: Negative for dizziness, speech difficulty, weakness, numbness and headaches.       3-4 weekends when he works doing repetitive tasks will get tingly and weak sensation. Some waking up middle of night that hands have fallen asleep. 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  Hematological: Negative for adenopathy. Does not bruise/bleed easily.  Psychiatric/Behavioral: Negative for behavioral problems, confusion, dysphoric mood and sleep disturbance. The patient is not nervous/anxious.     Past Medical History:  Diagnosis Date  . Acid reflux   . Allergy   . Anxiety   . Depression   . PTSD (post-traumatic stress disorder)      Social History   Socioeconomic History  . Marital status: Married    Spouse name: Not on file  . Number of children: Not on file   . Years of education: Not on file  . Highest education level: Not on file  Occupational History  . Not on file  Social Needs  . Financial resource strain: Not on file  . Food insecurity    Worry: Not on file    Inability: Not on file  . Transportation needs    Medical: Not on file    Non-medical: Not on file  Tobacco Use  . Smoking status: Former Smoker    Packs/day: 0.50    Years: 0.20    Pack years: 0.10    Types: Cigarettes  . Smokeless tobacco: Never Used  Substance and Sexual Activity  . Alcohol use: No  . Drug use: Not on file  . Sexual activity: Yes  Lifestyle  . Physical activity    Days per week: Not on file    Minutes per session: Not on file  . Stress: Not on file  Relationships  . Social Herbalist on phone: Not on file    Gets together: Not on file    Attends religious service: Not on file    Active member of club or organization: Not on file    Attends meetings of clubs or organizations: Not on file    Relationship status: Not on file  . Intimate partner violence    Fear of current or ex partner: Not on file    Emotionally abused: Not on file    Physically abused: Not on  file    Forced sexual activity: Not on file  Other Topics Concern  . Not on file  Social History Narrative  . Not on file    No past surgical history on file.  Family History  Problem Relation Age of Onset  . Depression Mother   . Diabetes Mother   . Alcohol abuse Father   . Hypertension Father   . Alcohol abuse Sister   . Alcohol abuse Brother     Allergies  Allergen Reactions  . Wellbutrin [Bupropion] Hives    On palms and soles  . Codeine Itching and Anxiety    Current Outpatient Medications on File Prior to Visit  Medication Sig Dispense Refill  . baclofen (LIORESAL) 10 MG tablet Take 10 mg by mouth 3 (three) times daily.    . naproxen (NAPROSYN) 500 MG tablet Take 1 tablet (500 mg total) by mouth 2 (two) times daily with a meal. 30 tablet 0  .  predniSONE (STERAPRED UNI-PAK 21 TAB) 10 MG (21) TBPK tablet Use as directed 21 tablet 0   No current facility-administered medications on file prior to visit.     There were no vitals taken for this visit.      Objective:   Physical Exam  General Mental Status- Alert. General Appearance- Not in acute distress.   Skin General: Color- Normal Color. Moisture- Normal Moisture. Small scattered moles seen on back. Non look suspicious.  Neck Carotid Arteries- Normal color. Moisture- Normal Moisture. No carotid bruits. No JVD.  Chest and Lung Exam Auscultation: Breath Sounds:-Normal.  Cardiovascular Auscultation:Rythm- Regular. Murmurs & Other Heart Sounds:Auscultation of the heart reveals- No Murmurs.  Abdomen Inspection:-Inspeection Normal. Palpation/Percussion:Note:No mass. Palpation and Percussion of the abdomen reveal- Non Tender, Non Distended + BS, no rebound or guarding.   Neurologic Cranial Nerve exam:- CN III-XII intact(No nystagmus), symmetric smile. Strength:- 5/5 equal and symmetric strength both upper and lower extremities.  Left upper ext- negative phalens sign.      Assessment & Plan:  For you wellness exam today I have ordered cbc, cmp and lipid panel.  Flu vaccine today.  Recommend exercise and healthy diet.  We will let you know lab results as they come in.  Follow up date appointment will be determined after lab review.    Regarding pt hand symptoms, I explained/counseled  pt that likely dx is carpel tunnel syndrome. Use wrist cock up splint. Ibuprofen otc. Take break from yard work. If pain persist can refer to sports med or orthopedist.   [phone] charge also as did address and counsel pt on probable condition.  Mackie Pai, PA-C

## 2019-05-10 NOTE — Patient Instructions (Addendum)
For you wellness exam today I have ordered cbc, cmp and lipid panel.  Flu vaccine today.  Recommend exercise and healthy diet.  We will let you know lab results as they come in.  Follow up date appointment will be determined after lab review.   Regarding pt hand symptoms, I explained/counseled  pt that likely dx is carpel tunnel syndrome. Use wrist cock up splint. Ibuprofen otc. Take break from yard work. If pain persist can refer to sports med or orthopedist.   Preventive Care 37-43 Years Old, Male Preventive care refers to lifestyle choices and visits with your health care provider that can promote health and wellness. This includes:  A yearly physical exam. This is also called an annual well check.  Regular dental and eye exams.  Immunizations.  Screening for certain conditions.  Healthy lifestyle choices, such as eating a healthy diet, getting regular exercise, not using drugs or products that contain nicotine and tobacco, and limiting alcohol use. What can I expect for my preventive care visit? Physical exam Your health care provider will check:  Height and weight. These may be used to calculate body mass index (BMI), which is a measurement that tells if you are at a healthy weight.  Heart rate and blood pressure.  Your skin for abnormal spots. Counseling Your health care provider may ask you questions about:  Alcohol, tobacco, and drug use.  Emotional well-being.  Home and relationship well-being.  Sexual activity.  Eating habits.  Work and work Statistician. What immunizations do I need?  Influenza (flu) vaccine  This is recommended every year. Tetanus, diphtheria, and pertussis (Tdap) vaccine  You may need a Td booster every 10 years. Varicella (chickenpox) vaccine  You may need this vaccine if you have not already been vaccinated. Human papillomavirus (HPV) vaccine  If recommended by your health care provider, you may need three doses over 6  months. Measles, mumps, and rubella (MMR) vaccine  You may need at least one dose of MMR. You may also need a second dose. Meningococcal conjugate (MenACWY) vaccine  One dose is recommended if you are 32-71 years old and a Market researcher living in a residence hall, or if you have one of several medical conditions. You may also need additional booster doses. Pneumococcal conjugate (PCV13) vaccine  You may need this if you have certain conditions and were not previously vaccinated. Pneumococcal polysaccharide (PPSV23) vaccine  You may need one or two doses if you smoke cigarettes or if you have certain conditions. Hepatitis A vaccine  You may need this if you have certain conditions or if you travel or work in places where you may be exposed to hepatitis A. Hepatitis B vaccine  You may need this if you have certain conditions or if you travel or work in places where you may be exposed to hepatitis B. Haemophilus influenzae type b (Hib) vaccine  You may need this if you have certain risk factors. You may receive vaccines as individual doses or as more than one vaccine together in one shot (combination vaccines). Talk with your health care provider about the risks and benefits of combination vaccines. What tests do I need? Blood tests  Lipid and cholesterol levels. These may be checked every 5 years starting at age 1.  Hepatitis C test.  Hepatitis B test. Screening   Diabetes screening. This is done by checking your blood sugar (glucose) after you have not eaten for a while (fasting).  Sexually transmitted disease (STD) testing. Talk  with your health care provider about your test results, treatment options, and if necessary, the need for more tests. Follow these instructions at home: Eating and drinking   Eat a diet that includes fresh fruits and vegetables, whole grains, lean protein, and low-fat dairy products.  Take vitamin and mineral supplements as  recommended by your health care provider.  Do not drink alcohol if your health care provider tells you not to drink.  If you drink alcohol: ? Limit how much you have to 0-2 drinks a day. ? Be aware of how much alcohol is in your drink. In the [place] one drink equals one 12 oz bottle of beer (355 mL), one 5 oz glass of wine (148 mL), or one 1 oz glass of hard liquor (44 mL). Lifestyle  Take daily care of your teeth and gums.  Stay active. Exercise for at least 30 minutes on 5 or more days each week.  Do not use any products that contain nicotine or tobacco, such as cigarettes, e-cigarettes, and chewing tobacco. If you need help quitting, ask your health care provider.  If you are sexually active, practice safe sex. Use a condom or other form of protection to prevent STIs (sexually transmitted infections). What's next?  Go to your health care provider once a year for a well check visit.  Ask your health care provider how often you should have your eyes and teeth checked.  Stay up to date on all vaccines. This information is not intended to replace advice given to you by your health care provider. Make sure you discuss any questions you have with your health care provider. Document Released: 10/02/2001 Document Revised: 07/31/2018 Document Reviewed: 07/31/2018 Elsevier Patient Education  [place] tunnel syndrome is a condition that causes pain in your hand and arm. The carpal tunnel is a narrow area that is on the palm side of your wrist. Repeated wrist motion or certain diseases may cause swelling in the tunnel. This swelling can pinch the main nerve in the wrist (median nerve). What are the causes? This condition may be caused by:  Repeated wrist motions.  Wrist injuries.  Arthritis.  A sac of fluid (cyst) or abnormal growth (tumor) in the carpal tunnel.  Fluid buildup during pregnancy. Sometimes the cause is not known. What  increases the risk? The following factors may make you more likely to develop this condition:  Having a job in which you move your wrist in the same way many times. This includes jobs like being a Software engineer or a Scientist, water quality.  Being a woman.  Having other health conditions, such as: ? Diabetes. ? Obesity. ? A thyroid gland that is not active enough (hypothyroidism). ? Kidney failure. What are the signs or symptoms? Symptoms of this condition include:  A tingling feeling in your fingers.  Tingling or a loss of feeling (numbness) in your hand.  Pain in your entire arm. This pain may get worse when you bend your wrist and elbow for a long time.  Pain in your wrist that goes up your arm to your shoulder.  Pain that goes down into your palm or fingers.  A weak feeling in your hands. You may find it hard to grab and hold items. You may feel worse at night. How is this diagnosed? This condition is diagnosed with a medical history and physical exam. You may also have tests, such as:  Electromyogram (EMG). This test checks the signals that  the nerves send to the muscles.  Nerve conduction study. This test checks how well signals pass through your nerves.  Imaging tests, such as X-rays, ultrasound, and MRI. These tests check for what might be the cause of your condition. How is this treated? This condition may be treated with:  Lifestyle changes. You will be asked to stop or change the activity that caused your problem.  Doing exercise and activities that make bones and muscles stronger (physical therapy).  Learning how to use your hand again (occupational therapy).  Medicines for pain and swelling (inflammation). You may have injections in your wrist.  A wrist splint.  Surgery. Follow these instructions at home: If you have a splint:  Wear the splint as told by your doctor. Remove it only as told by your doctor.  Loosen the splint if your fingers: ? Tingle. ? Lose feeling  (become numb). ? Turn cold and blue.  Keep the splint clean.  If the splint is not waterproof: ? Do not let it get wet. ? Cover it with a watertight covering when you take a bath or a shower. Managing pain, stiffness, and swelling   If told, put ice on the painful area: ? If you have a removable splint, remove it as told by your doctor. ? Put ice in a plastic bag. ? Place a towel between your skin and the bag. ? Leave the ice on for 20 minutes, 2-3 times per day. General instructions  Take over-the-counter and prescription medicines only as told by your doctor.  Rest your wrist from any activity that may cause pain. If needed, talk with your boss at work about changes that can help your wrist heal.  Do any exercises as told by your doctor, physical therapist, or occupational therapist.  Keep all follow-up visits as told by your doctor. This is important. Contact a doctor if:  You have new symptoms.  Medicine does not help your pain.  Your symptoms get worse. Get help right away if:  You have very bad numbness or tingling in your wrist or hand. Summary  Carpal tunnel syndrome is a condition that causes pain in your hand and arm.  It is often caused by repeated wrist motions.  Lifestyle changes and medicines are used to treat this problem. Surgery may help in very bad cases.  Follow your doctor's instructions about wearing a splint, resting your wrist, keeping follow-up visits, and calling for help. This information is not intended to replace advice given to you by your health care provider. Make sure you discuss any questions you have with your health care provider. Document Released: 07/26/2011 Document Revised: 12/13/2017 Document Reviewed: 12/13/2017 Elsevier Patient Education  2020 [employer].

## 2019-05-11 ENCOUNTER — Encounter: Payer: Self-pay | Admitting: Medical

## 2019-05-11 ENCOUNTER — Other Ambulatory Visit: Payer: Self-pay

## 2019-05-11 ENCOUNTER — Ambulatory Visit (INDEPENDENT_AMBULATORY_CARE_PROVIDER_SITE_OTHER): Payer: 59 | Admitting: Medical

## 2019-05-11 VITALS — BP 114/68 | HR 61 | Temp 97.3°F | Resp 16 | Ht 71.0 in | Wt 260.6 lb

## 2019-05-11 DIAGNOSIS — Z23 Encounter for immunization: Secondary | ICD-10-CM | POA: Diagnosis not present

## 2019-05-11 DIAGNOSIS — R202 Paresthesia of skin: Secondary | ICD-10-CM

## 2019-05-11 DIAGNOSIS — Z0001 Encounter for general adult medical examination with abnormal findings: Secondary | ICD-10-CM | POA: Diagnosis not present

## 2019-05-11 DIAGNOSIS — Z0000 Encounter for general adult medical examination without abnormal findings: Secondary | ICD-10-CM

## 2019-05-11 LAB — COMPREHENSIVE METABOLIC PANEL
ALT: 45 U/L (ref 0–53)
AST: 22 U/L (ref 0–37)
Albumin: 4.4 g/dL (ref 3.5–5.2)
Alkaline Phosphatase: 68 U/L (ref 39–117)
BUN: 13 mg/dL (ref 6–23)
CO2: 26 mEq/L (ref 19–32)
Calcium: 9.4 mg/dL (ref 8.4–10.5)
Chloride: 103 mEq/L (ref 96–112)
Creatinine, Ser: 0.75 mg/dL (ref 0.40–1.50)
GFR: 117.16 mL/min (ref >60.00)
Glucose, Bld: 90 mg/dL (ref 70–99)
Potassium: 4.2 mEq/L (ref 3.5–5.1)
Sodium: 138 mEq/L (ref 135–145)
Total Bilirubin: 0.6 mg/dL (ref 0.2–1.2)
Total Protein: 7.2 g/dL (ref 6.0–8.3)

## 2019-05-11 LAB — CBC WITH DIFFERENTIAL/PLATELET
Basophils Absolute: 0.1 10*3/uL (ref 0.0–0.1)
Basophils Relative: 0.9 % (ref 0.0–3.0)
Eosinophils Absolute: 0.2 10*3/uL (ref 0.0–0.7)
Eosinophils Relative: 3.9 % (ref 0.0–5.0)
HCT: 47.6 % (ref 39.0–52.0)
Hemoglobin: 16.3 g/dL (ref 13.0–17.0)
Lymphocytes Relative: 44 % (ref 12.0–46.0)
Lymphs Abs: 2.6 10*3/uL (ref 0.7–4.0)
MCHC: 34.2 g/dL (ref 30.0–36.0)
MCV: 91.3 fl (ref 78.0–100.0)
Monocytes Absolute: 0.5 10*3/uL (ref 0.1–1.0)
Monocytes Relative: 7.9 % (ref 3.0–12.0)
Neutro Abs: 2.6 10*3/uL (ref 1.4–7.7)
Neutrophils Relative %: 43.3 % (ref 43.0–77.0)
Platelets: 191 10*3/uL (ref 150.0–400.0)
RBC: 5.21 Mil/uL (ref 4.22–5.81)
RDW: 13.2 % (ref 11.5–15.5)
WBC: 5.9 10*3/uL (ref 4.0–10.5)

## 2019-05-11 LAB — LIPID PANEL
Cholesterol: 184 mg/dL (ref 0–200)
HDL: 32 mg/dL — ABNORMAL LOW (ref >39.00)
NonHDL: 152.32
Total CHOL/HDL Ratio: 6
Triglycerides: 219 mg/dL — ABNORMAL HIGH (ref 0.0–149.0)
VLDL: 43.8 mg/dL — ABNORMAL HIGH (ref 0.0–40.0)

## 2019-05-11 LAB — LDL CHOLESTEROL, DIRECT: Direct LDL: 121 mg/dL

## 2019-05-11 NOTE — Addendum Note (Signed)
Addended by: Hinton Dyer on: 05/11/2019 09:13 AM   Modules accepted: Orders

## 2019-05-15 DIAGNOSIS — H5213 Myopia, bilateral: Secondary | ICD-10-CM | POA: Diagnosis not present

## 2019-05-15 DIAGNOSIS — H52223 Regular astigmatism, bilateral: Secondary | ICD-10-CM | POA: Diagnosis not present

## 2020-05-30 ENCOUNTER — Ambulatory Visit (INDEPENDENT_AMBULATORY_CARE_PROVIDER_SITE_OTHER): Payer: 59 | Admitting: Medical

## 2020-05-30 ENCOUNTER — Encounter: Payer: Self-pay | Admitting: Medical

## 2020-05-30 ENCOUNTER — Other Ambulatory Visit: Payer: Self-pay

## 2020-05-30 VITALS — BP 132/86 | HR 61 | Resp 18 | Ht 71.0 in | Wt 274.0 lb

## 2020-05-30 DIAGNOSIS — Z0000 Encounter for general adult medical examination without abnormal findings: Secondary | ICD-10-CM | POA: Diagnosis not present

## 2020-05-30 DIAGNOSIS — Z23 Encounter for immunization: Secondary | ICD-10-CM

## 2020-05-30 LAB — CBC WITH DIFFERENTIAL/PLATELET
Absolute Monocytes: 490 cells/uL (ref 200–950)
Basophils Absolute: 77 cells/uL (ref 0–200)
Basophils Relative: 1.1 %
Eosinophils Absolute: 266 cells/uL (ref 15–500)
Eosinophils Relative: 3.8 %
HCT: 47.5 % (ref 38.5–50.0)
Hemoglobin: 16 g/dL (ref 13.2–17.1)
Lymphs Abs: 2814 cells/uL (ref 850–3900)
MCH: 30.5 pg (ref 27.0–33.0)
MCHC: 33.7 g/dL (ref 32.0–36.0)
MCV: 90.5 fL (ref 80.0–100.0)
MPV: 10.8 fL (ref 7.5–12.5)
Monocytes Relative: 7 %
Neutro Abs: 3353 cells/uL (ref 1500–7800)
Neutrophils Relative %: 47.9 %
Platelets: 215 10*3/uL (ref 140–400)
RBC: 5.25 10*6/uL (ref 4.20–5.80)
RDW: 13.1 % (ref 11.0–15.0)
Total Lymphocyte: 40.2 %
WBC: 7 10*3/uL (ref 3.8–10.8)

## 2020-05-30 LAB — COMPREHENSIVE METABOLIC PANEL
AG Ratio: 1.4 (calc) (ref 1.0–2.5)
ALT: 51 U/L — ABNORMAL HIGH (ref 9–46)
AST: 29 U/L (ref 10–40)
Albumin: 4.2 g/dL (ref 3.6–5.1)
Alkaline phosphatase (APISO): 80 U/L (ref 36–130)
BUN: 11 mg/dL (ref 7–25)
CO2: 27 mmol/L (ref 20–32)
Calcium: 9.2 mg/dL (ref 8.6–10.3)
Chloride: 102 mmol/L (ref 98–110)
Creat: 0.8 mg/dL (ref 0.60–1.35)
Globulin: 3.1 g/dL (calc) (ref 1.9–3.7)
Glucose, Bld: 82 mg/dL (ref 65–99)
Potassium: 4.3 mmol/L (ref 3.5–5.3)
Sodium: 137 mmol/L (ref 135–146)
Total Bilirubin: 0.7 mg/dL (ref 0.2–1.2)
Total Protein: 7.3 g/dL (ref 6.1–8.1)

## 2020-05-30 LAB — LIPID PANEL
Cholesterol: 218 mg/dL — ABNORMAL HIGH (ref <200)
HDL: 42 mg/dL (ref >=40)
LDL Cholesterol (Calc): 142 mg/dL (calc) — ABNORMAL HIGH
Non-HDL Cholesterol (Calc): 176 mg/dL (calc) — ABNORMAL HIGH (ref <130)
Total CHOL/HDL Ratio: 5.2 (calc) — ABNORMAL HIGH (ref <5.0)
Triglycerides: 206 mg/dL — ABNORMAL HIGH (ref <150)

## 2020-05-30 NOTE — Patient Instructions (Addendum)
For you wellness exam today I have ordered cbc, cmp and  lipid panel.  Vaccine given today flu vaccine.  Recommend exercise and healthy diet.  We will let you know lab results as they come in.  Follow up date appointment will be determined after lab review.    You do appear to have probable cts. Advise low dose ibuprofen wrist cock up splints at night. Might need time off work or refer to sports med. Give me update in 7-10 days.   Preventive Care 38-9 Years Old, Male Preventive care refers to lifestyle choices and visits with your health care provider that can promote health and wellness. This includes:  A yearly physical exam. This is also called an annual well check.  Regular dental and eye exams.  Immunizations.  Screening for certain conditions.  Healthy lifestyle choices, such as eating a healthy diet, getting regular exercise, not using drugs or products that contain nicotine and tobacco, and limiting alcohol use. What can I expect for my preventive care visit? Physical exam Your health care provider will check:  Height and weight. These may be used to calculate body mass index (BMI), which is a measurement that tells if you are at a healthy weight.  Heart rate and blood pressure.  Your skin for abnormal spots. Counseling Your health care provider may ask you questions about:  Alcohol, tobacco, and drug use.  Emotional well-being.  Home and relationship well-being.  Sexual activity.  Eating habits.  Work and work Statistician. What immunizations do I need?  Influenza (flu) vaccine  This is recommended every year. Tetanus, diphtheria, and pertussis (Tdap) vaccine  You may need a Td booster every 10 years. Varicella (chickenpox) vaccine  You may need this vaccine if you have not already been vaccinated. Human papillomavirus (HPV) vaccine  If recommended by your health care provider, you may need three doses over 6 months. Measles, mumps, and rubella  (MMR) vaccine  You may need at least one dose of MMR. You may also need a second dose. Meningococcal conjugate (MenACWY) vaccine  One dose is recommended if you are 51-49 years old and a Market researcher living in a residence hall, or if you have one of several medical conditions. You may also need additional booster doses. Pneumococcal conjugate (PCV13) vaccine  You may need this if you have certain conditions and were not previously vaccinated. Pneumococcal polysaccharide (PPSV23) vaccine  You may need one or two doses if you smoke cigarettes or if you have certain conditions. Hepatitis A vaccine  You may need this if you have certain conditions or if you travel or work in places where you may be exposed to hepatitis A. Hepatitis B vaccine  You may need this if you have certain conditions or if you travel or work in places where you may be exposed to hepatitis B. Haemophilus influenzae type b (Hib) vaccine  You may need this if you have certain risk factors. You may receive vaccines as individual doses or as more than one vaccine together in one shot (combination vaccines). Talk with your health care provider about the risks and benefits of combination vaccines. What tests do I need? Blood tests  Lipid and cholesterol levels. These may be checked every 5 years starting at age 35.  Hepatitis C test.  Hepatitis B test. Screening   Diabetes screening. This is done by checking your blood sugar (glucose) after you have not eaten for a while (fasting).  Sexually transmitted disease (STD) testing. Talk  with your health care provider about your test results, treatment options, and if necessary, the need for more tests. Follow these instructions at home: Eating and drinking   Eat a diet that includes fresh fruits and vegetables, whole grains, lean protein, and low-fat dairy products.  Take vitamin and mineral supplements as recommended by your health care provider.  Do  not drink alcohol if your health care provider tells you not to drink.  If you drink alcohol: ? Limit how much you have to 0-2 drinks a day. ? Be aware of how much alcohol is in your drink. In the [place] one drink equals one 12 oz bottle of beer (355 mL), one 5 oz glass of wine (148 mL), or one 1 oz glass of hard liquor (44 mL). Lifestyle  Take daily care of your teeth and gums.  Stay active. Exercise for at least 30 minutes on 5 or more days each week.  Do not use any products that contain nicotine or tobacco, such as cigarettes, e-cigarettes, and chewing tobacco. If you need help quitting, ask your health care provider.  If you are sexually active, practice safe sex. Use a condom or other form of protection to prevent STIs (sexually transmitted infections). What's next?  Go to your health care provider once a year for a well check visit.  Ask your health care provider how often you should have your eyes and teeth checked.  Stay up to date on all vaccines. This information is not intended to replace advice given to you by your health care provider. Make sure you discuss any questions you have with your health care provider. Document Revised: 07/31/2018 Document Reviewed: 07/31/2018 Elsevier Patient Education  [place] Syndrome  Carpal tunnel syndrome is a condition that causes pain in your hand and arm. The carpal tunnel is a narrow area that is on the palm side of your wrist. Repeated wrist motion or certain diseases may cause swelling in the tunnel. This swelling can pinch the main nerve in the wrist (median nerve). What are the causes? This condition may be caused by:  Repeated wrist motions.  Wrist injuries.  Arthritis.  A sac of fluid (cyst) or abnormal growth (tumor) in the carpal tunnel.  Fluid buildup during pregnancy. Sometimes the cause is not known. What increases the risk? The following factors may make you more likely to develop this  condition:  Having a job in which you move your wrist in the same way many times. This includes jobs like being a Software engineer or a Scientist, water quality.  Being a woman.  Having other health conditions, such as: ? Diabetes. ? Obesity. ? A thyroid gland that is not active enough (hypothyroidism). ? Kidney failure. What are the signs or symptoms? Symptoms of this condition include:  A tingling feeling in your fingers.  Tingling or a loss of feeling (numbness) in your hand.  Pain in your entire arm. This pain may get worse when you bend your wrist and elbow for a long time.  Pain in your wrist that goes up your arm to your shoulder.  Pain that goes down into your palm or fingers.  A weak feeling in your hands. You may find it hard to grab and hold items. You may feel worse at night. How is this diagnosed? This condition is diagnosed with a medical history and physical exam. You may also have tests, such as:  Electromyogram (EMG). This test checks the signals that the nerves send  to the muscles.  Nerve conduction study. This test checks how well signals pass through your nerves.  Imaging tests, such as X-rays, ultrasound, and MRI. These tests check for what might be the cause of your condition. How is this treated? This condition may be treated with:  Lifestyle changes. You will be asked to stop or change the activity that caused your problem.  Doing exercise and activities that make bones and muscles stronger (physical therapy).  Learning how to use your hand again (occupational therapy).  Medicines for pain and swelling (inflammation). You may have injections in your wrist.  A wrist splint.  Surgery. Follow these instructions at home: If you have a splint:  Wear the splint as told by your doctor. Remove it only as told by your doctor.  Loosen the splint if your fingers: ? Tingle. ? Lose feeling (become numb). ? Turn cold and blue.  Keep the splint clean.  If the splint is not  waterproof: ? Do not let it get wet. ? Cover it with a watertight covering when you take a bath or a shower. Managing pain, stiffness, and swelling   If told, put ice on the painful area: ? If you have a removable splint, remove it as told by your doctor. ? Put ice in a plastic bag. ? Place a towel between your skin and the bag. ? Leave the ice on for 20 minutes, 2-3 times per day. General instructions  Take over-the-counter and prescription medicines only as told by your doctor.  Rest your wrist from any activity that may cause pain. If needed, talk with your boss at work about changes that can help your wrist heal.  Do any exercises as told by your doctor, physical therapist, or occupational therapist.  Keep all follow-up visits as told by your doctor. This is important. Contact a doctor if:  You have new symptoms.  Medicine does not help your pain.  Your symptoms get worse. Get help right away if:  You have very bad numbness or tingling in your wrist or hand. Summary  Carpal tunnel syndrome is a condition that causes pain in your hand and arm.  It is often caused by repeated wrist motions.  Lifestyle changes and medicines are used to treat this problem. Surgery may help in very bad cases.  Follow your doctor's instructions about wearing a splint, resting your wrist, keeping follow-up visits, and calling for help. This information is not intended to replace advice given to you by your health care provider. Make sure you discuss any questions you have with your health care provider. Document Revised: 12/13/2017 Document Reviewed: 12/13/2017 Elsevier Patient Education  [place].

## 2020-05-30 NOTE — Progress Notes (Signed)
Subjective:    Patient ID: Glenn Mendoza, male    DOB: 10/02/1980, 39 y.o.   MRN: [id number]  HPI Pt is schedule for CPE/wellness exam  Got flu vaccine today. He had covid vaccine back in the fall.  Last year pt had been dieting to lose weight. He has lost about 90 lbs inpastwith modified ketogenic diet(maintained the weight loss). He has kept off most of the wait. Child almost 2 yr old now. Since covid pt reports.   He states he does walk around neighbood twice a week. Pt has not been working out. No sodas or sweet teas. Not smoking. Former vaper but stopped.   Pt is working part time at [employer]. Pt is going to [employer]..   Review of Systems  Constitutional: Negative for chills, fatigue and fever.  Respiratory: Negative for cough, chest tightness, shortness of breath and wheezing.   Cardiovascular: Negative for chest pain and palpitations.  Gastrointestinal: Negative for abdominal pain, diarrhea, nausea and vomiting.  Musculoskeletal: Negative for back pain, myalgias and neck pain.  Neurological: Negative for dizziness, speech difficulty, weakness and light-headedness.       Pt states he has some tingling of both hands/fingers. Both hands all digits feel tingly except pinkely. Wakes up at night and fingers feel tingly.   Pt types some with school. Repetitive movement ate work. Also occurs with driving.  Hematological: Negative for adenopathy. Does not bruise/bleed easily.  Psychiatric/Behavioral: Negative for behavioral problems, confusion and self-injury.   Past Medical History:  Diagnosis Date  . Acid reflux   . Allergy   . Anxiety   . Depression   . PTSD (post-traumatic stress disorder)      Social History   Socioeconomic History  . Marital status: Married    Spouse name: Not on file  . Number of children: Not on file  . Years of education: Not on file  . Highest education level: Not on file  Occupational History  . Not on file  Tobacco Use  .  Smoking status: Former Smoker    Packs/day: 0.50    Years: 0.20    Pack years: 0.10    Types: Cigarettes  . Smokeless tobacco: Never Used  Substance and Sexual Activity  . Alcohol use: No  . Drug use: Not on file  . Sexual activity: Yes  Other Topics Concern  . Not on file  Social History Narrative  . Not on file   Social Determinants of Health   Financial Resource Strain:   . Difficulty of Paying Living Expenses: Not on file  Food Insecurity:   . Worried About Charity fundraiser in the Last Year: Not on file  . Ran Out of Food in the Last Year: Not on file  Transportation Needs:   . Lack of Transportation (Medical): Not on file  . Lack of Transportation (Non-Medical): Not on file  Physical Activity:   . Days of Exercise per Week: Not on file  . Minutes of Exercise per Session: Not on file  Stress:   . Feeling of Stress : Not on file  Social Connections:   . Frequency of Communication with Friends and Family: Not on file  . Frequency of Social Gatherings with Friends and Family: Not on file  . Attends Religious Services: Not on file  . Active Member of Clubs or Organizations: Not on file  . Attends Archivist Meetings: Not on file  . Marital Status: Not on file  Intimate  Partner Violence:   . Fear of Current or Ex-Partner: Not on file  . Emotionally Abused: Not on file  . Physically Abused: Not on file  . Sexually Abused: Not on file    No past surgical history on file.  Family History  Problem Relation Age of Onset  . Depression Mother   . Diabetes Mother   . Alcohol abuse Father   . Hypertension Father   . Alcohol abuse Sister   . Alcohol abuse Brother     Allergies  Allergen Reactions  . Wellbutrin [Bupropion] Hives    On palms and soles  . Codeine Itching and Anxiety    No current outpatient medications on file prior to visit.   No current facility-administered medications on file prior to visit.    BP 132/86   Pulse 61   Resp 18    Ht 5\' 11"  (1.803 m)   Wt 274 lb (124.3 kg)   SpO2 100%   BMI 38.22 kg/m       Objective:   Physical Exam  General Mental Status- Alert. General Appearance- Not in acute distress.     Neck Carotid Arteries- Normal color. Moisture- Normal Moisture. No carotid bruits. No JVD.  Chest and Lung Exam Auscultation: Breath Sounds:-Normal.  Cardiovascular Auscultation:Rythm- Regular. Murmurs & Other Heart Sounds:Auscultation of the heart reveals- No Murmurs.  Abdomen Inspection:-Inspeection Normal. Palpation/Percussion:Note:No mass. Palpation and Percussion of the abdomen reveal- Non Tender, Non Distended + BS, no rebound or guarding.   Neurologic Cranial Nerve exam:- CN III-XII intact(No nystagmus), symmetric smile. Strength:- 5/5 equal and symmetric strength both upper and lower extremities.  Upper ext- wrist manipulation causes tingling to both index finger.   Skin- scattered mole on skin exam non worrisome on review.     Assessment & Plan:  For you wellness exam today I have ordered cbc, cmp and  lipid panel.  Vaccine given today flu vaccine.  Recommend exercise and healthy diet.  We will let you know lab results as they come in.  Follow up date appointment will be determined after lab review.    Mackie Pai, [place]   [id number] as addressed carpel tunnel syndrome.

## 2020-05-31 ENCOUNTER — Other Ambulatory Visit: Payer: Self-pay | Admitting: Medical

## 2020-05-31 ENCOUNTER — Telehealth: Payer: Self-pay | Admitting: Medical

## 2020-05-31 MED ORDER — FENOFIBRATE 48 MG PO TABS: 48.0000 mg | ORAL_TABLET | Freq: Every day | ORAL | 3 refills | Status: DC

## 2020-05-31 NOTE — Telephone Encounter (Signed)
Rx fenofibrate sent to pt pharmacy. 

## 2020-06-01 MED FILL — FENOFIBRATE 48 MG TABS: 48 | 30 days supply | Qty: 30 | Fill #0

## 2020-07-05 MED FILL — FENOFIBRATE 48 MG TABS: 48 | 30 days supply | Qty: 30 | Fill #1

## 2020-07-27 DIAGNOSIS — H52223 Regular astigmatism, bilateral: Secondary | ICD-10-CM | POA: Diagnosis not present

## 2020-07-27 DIAGNOSIS — H5213 Myopia, bilateral: Secondary | ICD-10-CM | POA: Diagnosis not present

## 2020-08-10 MED FILL — FENOFIBRATE 48 MG TABS: 48 | 30 days supply | Qty: 30 | Fill #2

## 2020-10-06 MED FILL — FENOFIBRATE 48 MG TABS: 48 | 30 days supply | Qty: 30 | Fill #3

## 2020-11-28 ENCOUNTER — Other Ambulatory Visit (HOSPITAL_BASED_OUTPATIENT_CLINIC_OR_DEPARTMENT_OTHER): Payer: Self-pay

## 2020-11-28 ENCOUNTER — Other Ambulatory Visit: Payer: Self-pay | Admitting: Medical

## 2020-11-28 MED ORDER — FENOFIBRATE 48 MG PO TABS
48.0000 mg | ORAL_TABLET | Freq: Every day | ORAL | 1 refills | Status: DC
  Filled 2020-11-28: qty 90, 90d supply, fill #0
  Filled 2021-03-22: qty 90, 90d supply, fill #1

## 2020-12-05 ENCOUNTER — Ambulatory Visit: Payer: 59 | Admitting: Medical

## 2020-12-05 ENCOUNTER — Other Ambulatory Visit (HOSPITAL_BASED_OUTPATIENT_CLINIC_OR_DEPARTMENT_OTHER): Payer: Self-pay

## 2020-12-05 ENCOUNTER — Other Ambulatory Visit: Payer: Self-pay

## 2020-12-05 VITALS — BP 112/71 | HR 97 | Resp 20 | Ht 71.0 in | Wt 265.2 lb

## 2020-12-05 DIAGNOSIS — F32A Depression, unspecified: Secondary | ICD-10-CM | POA: Diagnosis not present

## 2020-12-05 DIAGNOSIS — F411 Generalized anxiety disorder: Secondary | ICD-10-CM | POA: Diagnosis not present

## 2020-12-05 DIAGNOSIS — R5383 Other fatigue: Secondary | ICD-10-CM

## 2020-12-05 MED ORDER — VENLAFAXINE HCL ER 37.5 MG PO CP24
37.5000 mg | ORAL_CAPSULE | Freq: Every day | ORAL | 0 refills | Status: DC
  Filled 2020-12-05: qty 30, 30d supply, fill #0

## 2020-12-05 MED ORDER — BUSPIRONE HCL 7.5 MG PO TABS
7.5000 mg | ORAL_TABLET | Freq: Two times a day (BID) | ORAL | 0 refills | Status: DC
  Filled 2020-12-05: qty 60, 30d supply, fill #0

## 2020-12-05 NOTE — Progress Notes (Signed)
Subjective:    Patient ID: Glenn Mendoza, male    DOB: 1980/11/15, 40 y.o.   MRN: [id number]  HPI Pt in for depression and anxiety.  Pt stopped smoking about 5 years ago.  Pt has depression and anxiety. Pt phq-9 score was 21 and GAD 7 was 15.  Pt states more anxious than depressed.   Pt has 6 children and has blended family.   Pt got layed off during pandemic. He was layed off. 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 He states school and xwife very stressful.  Pt wants to be xray tech.  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 He has to wake up very early   With depression and anxiety he states started to drink heavily. He stopped drinking 12 days ago. He was drinking rum or vodka. About 8-10 shots at night about 5 days a week. Did this since August.  Pt claridies question #9 on phq-9 score and he states these were trasient and brief. Never entertained or made plans.   Pt had depression in past. Pt states had allergic reaction to wellbutrin. One other medication that he uses effected sex life. Pt thinks may have tried sertraline but he is not sure. No med use since 2015.    Review of Systems  Constitutional: Negative for chills, fatigue and fever.  Respiratory: Negative for cough, chest tightness, shortness of breath and wheezing.   Cardiovascular: Negative for chest pain and palpitations.  Gastrointestinal: Negative for abdominal pain.  Genitourinary: Negative for dysuria.  Musculoskeletal: Negative for back pain and myalgias.  Skin: Negative for rash.  Neurological: Negative for dizziness, speech difficulty, numbness and headaches.  Hematological: Negative for adenopathy.  Psychiatric/Behavioral: Positive for dysphoric mood. Negative for behavioral problems, confusion and suicidal ideas. The patient is not nervous/anxious.     Past Medical History:  Diagnosis Date  . Acid reflux   . Allergy   . Anxiety   . Depression   . PTSD (post-traumatic stress disorder)      Social  History   Socioeconomic History  . Marital status: Married    Spouse name: Not on file  . Number of children: Not on file  . Years of education: Not on file  . Highest education level: Not on file  Occupational History  . Not on file  Tobacco Use  . Smoking status: Former Smoker    Packs/day: 0.50    Years: 0.20    Pack years: 0.10    Types: Cigarettes  . Smokeless tobacco: Never Used  Substance and Sexual Activity  . Alcohol use: No  . Drug use: Not on file  . Sexual activity: Yes  Other Topics Concern  . Not on file  Social History Narrative  . Not on file   Social Determinants of Health   Financial Resource Strain: Not on file  Food Insecurity: Not on file  Transportation Needs: Not on file  Physical Activity: Not on file  Stress: Not on file  Social Connections: Not on file  Intimate Partner Violence: Not on file    No past surgical history on file.  Family History  Problem Relation Age of Onset  . Depression Mother   . Diabetes Mother   . Alcohol abuse Father   . Hypertension Father   . Alcohol abuse Sister   . Alcohol abuse Brother     Allergies  Allergen Reactions  . Wellbutrin [Bupropion] Hives    On palms and soles  . Codeine Itching  and Anxiety    Current Outpatient Medications on File Prior to Visit  Medication Sig Dispense Refill  . fenofibrate (TRICOR) 48 MG tablet Take 1 tablet (48 mg total) by mouth daily. 90 tablet 1   No current facility-administered medications on file prior to visit.    BP 112/71   Pulse 97   Resp 20   Ht 5\' 11"  (1.803 m)   Wt 265 lb 3.2 oz (120.3 kg)   SpO2 97%   BMI 36.99 kg/m       Objective:   Physical Exam   General Mental Status- Alert. General Appearance- Not in acute distress.   Skin General: Color- Normal Color. Moisture- Normal Moisture.  Neck Carotid Arteries- Normal color. Moisture- Normal Moisture. No carotid bruits. No JVD.  Chest and Lung Exam Auscultation: Breath  Sounds:-Normal.  Cardiovascular Auscultation:Rythm- Regular. Murmurs & Other Heart Sounds:Auscultation of the heart reveals- No Murmurs.  Abdomen Inspection:-Inspeection Normal. Palpation/Percussion:Note:No mass. Palpation and Percussion of the abdomen reveal- Non Tender, Non Distended + BS, no rebound or guarding.   Neurologic Cranial Nerve exam:- CN III-XII intact(No nystagmus), symmetric smile. Strength:- 5/5 equal and symmetric strength both upper and lower extremities.     Assessment & Plan:  Recent anxiety and depression over the past 1 to 2 years.  Some alcohol abuse secondary to both condition.  Glad to hear that she stopped drinking alcohol completely.  If you feel like you are struggling to maintain abstinence then would recommend AA.  Presently for anxiety depression start Effexor 37.5 mg dose.  You might need dose increase in 2 to 4 weeks.  We will decide on dose increase on follow-up.  Also go ahead and send in BuSpar 7.5 mg dose.  Start with Effexor first but BuSpar can be addition of medication for anxiety component.  Recommend healthy diet and exercise.  Try to get adequate sleep.  Consider medication for insomnia in future as well.  You did mention some fatigue today so we will get CBC, CMP and thyroid studies.  Follow-up in 2 to 3 weeks or as needed.

## 2020-12-05 NOTE — Patient Instructions (Addendum)
Recent anxiety and depression over the past 1 to 2 years.  Some alcohol abuse secondary to both condition.  Glad to hear that she stopped drinking alcohol completely.  If you feel like you are struggling to maintain abstinence then would recommend AA.  Presently for anxiety depression start Effexor 37.5 mg dose.  You might need dose increase in 2 to 4 weeks.  We will decide on dose increase on follow-up.  Also go ahead and send in BuSpar 7.5 mg dose.  Start with Effexor first but BuSpar can be addition of medication for anxiety component.  Also do not mention that if he has thoughts of harm to self or others then recommend ED evaluation.  For psychiatric evaluation typically recommend [facility], ED.  Recommend healthy diet and exercise.  Try to get adequate sleep.  Consider medication for insomnia in future as well.  You did mention some fatigue today so we will get CBC, CMP and thyroid studies.  Follow-up in 2 to 3 weeks or as needed.

## 2020-12-06 LAB — COMPREHENSIVE METABOLIC PANEL
ALT: 42 U/L (ref 0–53)
AST: 25 U/L (ref 0–37)
Albumin: 4.2 g/dL (ref 3.5–5.2)
Alkaline Phosphatase: 81 U/L (ref 39–117)
BUN: 15 mg/dL (ref 6–23)
CO2: 28 mEq/L (ref 19–32)
Calcium: 9.9 mg/dL (ref 8.4–10.5)
Chloride: 103 mEq/L (ref 96–112)
Creatinine, Ser: 1 mg/dL (ref 0.40–1.50)
GFR: 95.4 mL/min (ref >60.00)
Glucose, Bld: 70 mg/dL (ref 70–99)
Potassium: 4.1 mEq/L (ref 3.5–5.1)
Sodium: 139 mEq/L (ref 135–145)
Total Bilirubin: 0.5 mg/dL (ref 0.2–1.2)
Total Protein: 7.6 g/dL (ref 6.0–8.3)

## 2020-12-06 LAB — CBC WITH DIFFERENTIAL/PLATELET
Basophils Absolute: 0.1 10*3/uL (ref 0.0–0.1)
Basophils Relative: 1 % (ref 0.0–3.0)
Eosinophils Absolute: 0.1 10*3/uL (ref 0.0–0.7)
Eosinophils Relative: 2.5 % (ref 0.0–5.0)
HCT: 46.6 % (ref 39.0–52.0)
Hemoglobin: 15.9 g/dL (ref 13.0–17.0)
Lymphocytes Relative: 29.4 % (ref 12.0–46.0)
Lymphs Abs: 1.8 10*3/uL (ref 0.7–4.0)
MCHC: 34.2 g/dL (ref 30.0–36.0)
MCV: 91.2 fl (ref 78.0–100.0)
Monocytes Absolute: 0.5 10*3/uL (ref 0.1–1.0)
Monocytes Relative: 8.9 % (ref 3.0–12.0)
Neutro Abs: 3.5 10*3/uL (ref 1.4–7.7)
Neutrophils Relative %: 58.2 % (ref 43.0–77.0)
Platelets: 222 10*3/uL (ref 150.0–400.0)
RBC: 5.11 Mil/uL (ref 4.22–5.81)
RDW: 12.9 % (ref 11.5–15.5)
WBC: 6 10*3/uL (ref 4.0–10.5)

## 2020-12-06 LAB — TSH: TSH: 1.1 u[IU]/mL (ref 0.35–4.50)

## 2021-01-02 ENCOUNTER — Other Ambulatory Visit: Payer: Self-pay | Admitting: Medical

## 2021-01-02 ENCOUNTER — Other Ambulatory Visit (HOSPITAL_BASED_OUTPATIENT_CLINIC_OR_DEPARTMENT_OTHER): Payer: Self-pay

## 2021-01-03 ENCOUNTER — Other Ambulatory Visit (HOSPITAL_BASED_OUTPATIENT_CLINIC_OR_DEPARTMENT_OTHER): Payer: Self-pay

## 2021-01-03 MED ORDER — VENLAFAXINE HCL ER 37.5 MG PO CP24
37.5000 mg | ORAL_CAPSULE | Freq: Every day | ORAL | 0 refills | Status: DC
  Filled 2021-01-03: qty 30, 30d supply, fill #0

## 2021-01-23 ENCOUNTER — Other Ambulatory Visit (HOSPITAL_BASED_OUTPATIENT_CLINIC_OR_DEPARTMENT_OTHER): Payer: Self-pay

## 2021-01-23 MED ORDER — CARESTART COVID-19 HOME TEST VI KIT
PACK | 0 refills | Status: DC
  Filled 2021-01-23: qty 4, 4d supply, fill #0

## 2021-01-27 ENCOUNTER — Other Ambulatory Visit (HOSPITAL_BASED_OUTPATIENT_CLINIC_OR_DEPARTMENT_OTHER): Payer: Self-pay

## 2021-01-27 MED ORDER — CARESTART COVID-19 HOME TEST VI KIT
PACK | 0 refills | Status: DC
  Filled 2021-01-30: qty 4, 4d supply, fill #0

## 2021-01-30 ENCOUNTER — Other Ambulatory Visit (HOSPITAL_BASED_OUTPATIENT_CLINIC_OR_DEPARTMENT_OTHER): Payer: Self-pay

## 2021-02-16 ENCOUNTER — Other Ambulatory Visit (HOSPITAL_BASED_OUTPATIENT_CLINIC_OR_DEPARTMENT_OTHER): Payer: Self-pay

## 2021-02-16 ENCOUNTER — Other Ambulatory Visit: Payer: Self-pay | Admitting: Medical

## 2021-02-16 MED ORDER — VENLAFAXINE HCL ER 37.5 MG PO CP24
37.5000 mg | ORAL_CAPSULE | Freq: Every day | ORAL | 0 refills | Status: DC
  Filled 2021-02-16: qty 30, 30d supply, fill #0

## 2021-03-01 ENCOUNTER — Encounter: Payer: Self-pay | Admitting: Medical

## 2021-03-10 ENCOUNTER — Encounter: Payer: Self-pay | Admitting: Medical

## 2021-03-20 ENCOUNTER — Other Ambulatory Visit: Payer: Self-pay

## 2021-03-20 ENCOUNTER — Ambulatory Visit: Payer: 59 | Admitting: Medical

## 2021-03-20 VITALS — BP 137/80 | HR 88 | Ht 71.0 in | Wt 276.0 lb

## 2021-03-20 DIAGNOSIS — Z0184 Encounter for antibody response examination: Secondary | ICD-10-CM | POA: Diagnosis not present

## 2021-03-20 DIAGNOSIS — Z789 Other specified health status: Secondary | ICD-10-CM

## 2021-03-20 DIAGNOSIS — Z0000 Encounter for general adult medical examination without abnormal findings: Secondary | ICD-10-CM | POA: Diagnosis not present

## 2021-03-20 DIAGNOSIS — Z23 Encounter for immunization: Secondary | ICD-10-CM | POA: Diagnosis not present

## 2021-03-20 DIAGNOSIS — Z111 Encounter for screening for respiratory tuberculosis: Secondary | ICD-10-CM | POA: Diagnosis not present

## 2021-03-20 DIAGNOSIS — R030 Elevated blood-pressure reading, without diagnosis of hypertension: Secondary | ICD-10-CM

## 2021-03-20 NOTE — Patient Instructions (Addendum)
For you wellness exam today I have ordered cbc, cmp and lipid panel. Future labs.  Tdap today.  Immunity status testing for hep b, mmr and varicella.  Recommend exercise and healthy diet.  We will let you know lab results as they come in.  Follow up date appointment will be determined after lab review.    Your blood pressure was elevated on first check. Better on recheck. Recommend checking at home with at home cuff. If above 140/90 let me know.  Preventive Care 21-39 Years Old, Male Preventive care refers to lifestyle choices and visits with your health care provider that can promote health and wellness. This includes: A yearly physical exam. This is also called an annual wellness visit. Regular dental and eye exams. Immunizations. Screening for certain conditions. Healthy lifestyle choices, such as: Eating a healthy diet. Getting regular exercise. Not using drugs or products that contain nicotine and tobacco. Limiting alcohol use. What can I expect for my preventive care visit? Physical exam Your health care provider may check your: Height and weight. These may be used to calculate your BMI (body mass index). BMI is a measurement that tells if you are at a healthy weight. Heart rate and blood pressure. Body temperature. Skin for abnormal spots. Counseling Your health care provider may ask you questions about your: Past medical problems. Family's medical history. Alcohol, tobacco, and drug use. Emotional well-being. Home life and relationship well-being. Sexual activity. Diet, exercise, and sleep habits. Work and work environment. Access to firearms. What immunizations do I need?  Vaccines are usually given at various ages, according to a schedule. Your health care provider will recommend vaccines for you based on your age, medicalhistory, and lifestyle or other factors, such as travel or where you work. What tests do I need? Blood tests Lipid and cholesterol levels.  These may be checked every 5 years starting at age 20. Hepatitis C test. Hepatitis B test. Screening  Diabetes screening. This is done by checking your blood sugar (glucose) after you have not eaten for a while (fasting). Genital exam to check for testicular cancer or hernias. STD (sexually transmitted disease) testing, if you are at risk. Talk with your health care provider about your test results, treatment options,and if necessary, the need for more tests. Follow these instructions at home: Eating and drinking  Eat a healthy diet that includes fresh fruits and vegetables, whole grains, lean protein, and low-fat dairy products. Drink enough fluid to keep your urine pale yellow. Take vitamin and mineral supplements as recommended by your health care provider. Do not drink alcohol if your health care provider tells you not to drink. If you drink alcohol: Limit how much you have to 0-2 drinks a day. Be aware of how much alcohol is in your drink. In the [place] one drink equals one 12 oz bottle of beer (355 mL), one 5 oz glass of wine (148 mL), or one 1 oz glass of hard liquor (44 mL).  Lifestyle Take daily care of your teeth and gums. Brush your teeth every morning and night with fluoride toothpaste. Floss one time each day. Stay active. Exercise for at least 30 minutes 5 or more days each week. Do not use any products that contain nicotine or tobacco, such as cigarettes, e-cigarettes, and chewing tobacco. If you need help quitting, ask your health care provider. Do not use drugs. If you are sexually active, practice safe sex. Use a condom or other form of protection to prevent STIs (sexually transmitted   infections). Find healthy ways to cope with stress, such as: Meditation, yoga, or listening to music. Journaling. Talking to a trusted person. Spending time with friends and family. Safety Always wear your seat belt while driving or riding in a vehicle. Do not drive: If you have been  drinking alcohol. Do not ride with someone who has been drinking. When you are tired or distracted. While texting. Wear a helmet and other protective equipment during sports activities. If you have firearms in your house, make sure you follow all gun safety procedures. Seek help if you have been physically or sexually abused. What's next? Go to your health care provider once a year for an annual wellness visit. Ask your health care provider how often you should have your eyes and teeth checked. Stay up to date on all vaccines. This information is not intended to replace advice given to you by your health care provider. Make sure you discuss any questions you have with your healthcare provider. Document Revised: 04/22/2019 Document Reviewed: 07/31/2018 Elsevier Patient Education  2022 Elsevier Inc.  

## 2021-03-20 NOTE — Progress Notes (Signed)
Subjective:    Patient ID: Glenn Mendoza, male    DOB: 02/11/1981, 40 y.o.   MRN: [id number]  HPI Pt in for wellness exam today. 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   Pt up to date on covid vaccines.   Pt had child hood vaccines done at [facility] office. 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  He states he does walk around neighbood twice a week. Pt has been working out. No sodas or sweet teas. Not smoking. Former vaper but stopped.  Decide to go ahead and do  his 2022 wellness exam  Pt bp is high today initially.   Review of Systems  Constitutional:  Negative for chills, fatigue and fever.  HENT:  Negative for congestion, drooling and ear discharge.   Respiratory:  Negative for chest tightness, shortness of breath and wheezing.   Cardiovascular:  Negative for chest pain.  Gastrointestinal:  Negative for abdominal distention, abdominal pain, blood in stool, constipation, nausea and vomiting.  Genitourinary:  Negative for difficulty urinating, enuresis, flank pain, frequency, hematuria, penile pain, scrotal swelling and urgency.  Musculoskeletal:  Negative for back pain, joint swelling, myalgias and neck stiffness.  Skin:  Negative for rash.   Past Medical History:  Diagnosis Date   Acid reflux    Allergy    Anxiety    Depression    PTSD (post-traumatic stress disorder)      Social History   Socioeconomic History   Marital status: Married    Spouse name: Not on file   Number of children: Not on file   Years of education: Not on file   Highest education level: Not on file  Occupational History   Not on file  Tobacco Use   Smoking status: Former    Packs/day: 0.50    Years: 0.20    Pack years: 0.10    Types: Cigarettes   Smokeless tobacco: Never  Substance and Sexual Activity   Alcohol use: No   Drug use: Not on file   Sexual  activity: Yes  Other Topics Concern   Not on file  Social History Narrative   Not on file   Social Determinants of Health   Financial Resource Strain: Not on file  Food Insecurity: Not on file  Transportation Needs: Not on file  Physical Activity: Not on file  Stress: Not on file  Social Connections: Not on file  Intimate Partner Violence: Not on file    No past surgical history on file.  Family History  Problem Relation Age of Onset   Depression Mother    Diabetes Mother    Alcohol abuse Father    Hypertension Father    Alcohol abuse Sister    Alcohol abuse Brother     Allergies  Allergen Reactions   Wellbutrin [Bupropion] Hives    On palms and soles   Codeine Itching and Anxiety    Current Outpatient Medications on File Prior to Visit  Medication Sig Dispense Refill   busPIRone (BUSPAR) 7.5 MG tablet Take 1 tablet (7.5 mg total) by mouth 2 (two) times daily. 60 tablet 0   COVID-19 At Home Antigen Test (CARESTART COVID-19 HOME TEST) KIT Use as directed within package instructions 4 each 0   fenofibrate (TRICOR) 48 MG tablet Take 1 tablet (48 mg total) by mouth daily. 90 tablet 1   venlafaxine  XR (EFFEXOR XR) 37.5 MG 24 hr capsule Take 1 capsule (37.5 mg total) by mouth daily with breakfast. 30 capsule 0   No current facility-administered medications on file prior to visit.    BP (!) 158/88   Pulse 88   Ht 5' 11"  (1.803 m)   Wt 276 lb (125.2 kg)   SpO2 99%   BMI 38.49 kg/m        Objective:   Physical Exam  General Mental Status- Alert. General Appearance- Not in acute distress.   Skin General: Color- Normal Color. Moisture- Normal Moisture.  Neck Carotid Arteries- Normal color. Moisture- Normal Moisture. No carotid bruits. No JVD.  Chest and Lung Exam Auscultation: Breath Sounds:-Normal.  Cardiovascular Auscultation:Rythm- Regular. Murmurs & Other Heart Sounds:Auscultation of the heart reveals- No Murmurs.  Abdomen Inspection:-Inspeection  Normal. Palpation/Percussion:Note:No mass. Palpation and Percussion of the abdomen reveal- Non Tender, Non Distended + BS, no rebound or guarding.   Neurologic Cranial Nerve exam:- CN III-XII intact(No nystagmus), symmetric smile. Strength:- 5/5 equal and symmetric strength both upper and lower extremities.       Assessment & Plan:   For you wellness exam today I have ordered cbc, cmp and lipid panel. Future labs.  Tdap today.  Immunity status testing for hep b, mmr and varicella.  Recommend exercise and healthy diet.  We will let you know lab results as they come in.  Follow up date appointment will be determined after lab review.    Your blood pressure was elevated on first check. Better on recheck. Recommend checking at home with at home cuff. If above 140/90 let me know.

## 2021-03-22 ENCOUNTER — Other Ambulatory Visit: Payer: Self-pay | Admitting: Medical

## 2021-03-22 ENCOUNTER — Ambulatory Visit: Payer: 59

## 2021-03-22 ENCOUNTER — Other Ambulatory Visit: Payer: Self-pay

## 2021-03-22 ENCOUNTER — Other Ambulatory Visit (INDEPENDENT_AMBULATORY_CARE_PROVIDER_SITE_OTHER): Payer: 59

## 2021-03-22 ENCOUNTER — Other Ambulatory Visit (HOSPITAL_BASED_OUTPATIENT_CLINIC_OR_DEPARTMENT_OTHER): Payer: Self-pay

## 2021-03-22 DIAGNOSIS — Z789 Other specified health status: Secondary | ICD-10-CM

## 2021-03-22 DIAGNOSIS — Z0184 Encounter for antibody response examination: Secondary | ICD-10-CM

## 2021-03-22 DIAGNOSIS — Z0000 Encounter for general adult medical examination without abnormal findings: Secondary | ICD-10-CM | POA: Diagnosis not present

## 2021-03-22 LAB — TB SKIN TEST
Induration: 0 mm
TB Skin Test: NEGATIVE

## 2021-03-22 MED ORDER — VENLAFAXINE HCL ER 37.5 MG PO CP24
37.5000 mg | ORAL_CAPSULE | Freq: Every day | ORAL | 0 refills | Status: DC
  Filled 2021-03-22: qty 30, 30d supply, fill #0

## 2021-03-22 NOTE — Addendum Note (Signed)
Addended by: Manuela Schwartz on: 03/22/2021 04:03 PM   Modules accepted: Orders

## 2021-03-23 LAB — CBC WITH DIFFERENTIAL/PLATELET
Basophils Absolute: 0.1 10*3/uL (ref 0.0–0.1)
Basophils Relative: 1.1 % (ref 0.0–3.0)
Eosinophils Absolute: 0.2 10*3/uL (ref 0.0–0.7)
Eosinophils Relative: 3.5 % (ref 0.0–5.0)
HCT: 43.3 % (ref 39.0–52.0)
Hemoglobin: 15.1 g/dL (ref 13.0–17.0)
Lymphocytes Relative: 28.4 % (ref 12.0–46.0)
Lymphs Abs: 2 10*3/uL (ref 0.7–4.0)
MCHC: 34.9 g/dL (ref 30.0–36.0)
MCV: 91.7 fl (ref 78.0–100.0)
Monocytes Absolute: 0.5 10*3/uL (ref 0.1–1.0)
Monocytes Relative: 7.3 % (ref 3.0–12.0)
Neutro Abs: 4.2 10*3/uL (ref 1.4–7.7)
Neutrophils Relative %: 59.7 % (ref 43.0–77.0)
Platelets: 188 10*3/uL (ref 150.0–400.0)
RBC: 4.72 Mil/uL (ref 4.22–5.81)
RDW: 13.7 % (ref 11.5–15.5)
WBC: 7 10*3/uL (ref 4.0–10.5)

## 2021-03-23 LAB — COMPREHENSIVE METABOLIC PANEL
ALT: 59 U/L — ABNORMAL HIGH (ref 0–53)
AST: 37 U/L (ref 0–37)
Albumin: 4.3 g/dL (ref 3.5–5.2)
Alkaline Phosphatase: 91 U/L (ref 39–117)
BUN: 16 mg/dL (ref 6–23)
CO2: 24 mEq/L (ref 19–32)
Calcium: 9.9 mg/dL (ref 8.4–10.5)
Chloride: 104 mEq/L (ref 96–112)
Creatinine, Ser: 0.83 mg/dL (ref 0.40–1.50)
GFR: 110.71 mL/min (ref >60.00)
Glucose, Bld: 85 mg/dL (ref 70–99)
Potassium: 4 mEq/L (ref 3.5–5.1)
Sodium: 139 mEq/L (ref 135–145)
Total Bilirubin: 0.5 mg/dL (ref 0.2–1.2)
Total Protein: 7.4 g/dL (ref 6.0–8.3)

## 2021-03-23 LAB — LIPID PANEL
Cholesterol: 201 mg/dL — ABNORMAL HIGH (ref 0–200)
HDL: 43.3 mg/dL (ref >39.00)
NonHDL: 157.71
Total CHOL/HDL Ratio: 5
Triglycerides: 279 mg/dL — ABNORMAL HIGH (ref 0.0–149.0)
VLDL: 55.8 mg/dL — ABNORMAL HIGH (ref 0.0–40.0)

## 2021-03-23 LAB — LDL CHOLESTEROL, DIRECT: Direct LDL: 133 mg/dL

## 2021-03-28 ENCOUNTER — Ambulatory Visit (INDEPENDENT_AMBULATORY_CARE_PROVIDER_SITE_OTHER): Payer: 59

## 2021-03-28 ENCOUNTER — Ambulatory Visit: Payer: 59 | Admitting: Medical

## 2021-03-28 ENCOUNTER — Other Ambulatory Visit: Payer: Self-pay

## 2021-03-28 DIAGNOSIS — Z23 Encounter for immunization: Secondary | ICD-10-CM

## 2021-03-28 NOTE — Progress Notes (Addendum)
Patient here today for MMR vaccine and Hep B. 0.5mL MMR given in left arm SQ. 1mL hep B given in right deltoid IM. Patient tolerated well. VIS given. [facility] updated.   Patients form for school has been updated and given to patient.   Edward Saguier, PA-C  

## 2021-03-31 LAB — MEASLES/MUMPS/RUBELLA IMMUNITY
Mumps IgG: <9 AU/mL — ABNORMAL LOW
Rubella: 0.93 Index — ABNORMAL LOW
Rubeola IgG: 291 AU/mL

## 2021-03-31 LAB — VARICELLA-ZOSTER VIRUS AB(IMMUNITY SCREEN),ACIF,SERUM: VARICELLA ZOSTER VIRUS AB (IMMUNITY SCR),ACIF SERUM: >=1:4 {titer}

## 2021-03-31 LAB — HEPATITIS B SURFACE ANTIBODY, QUANTITATIVE: Hep B S AB Quant (Post): <5 m[IU]/mL — ABNORMAL LOW (ref >=10)

## 2021-04-05 ENCOUNTER — Telehealth: Payer: Self-pay | Admitting: Medical

## 2021-04-05 NOTE — Addendum Note (Signed)
Addended by: Anabel Halon on: 04/05/2021 08:03 PM   Modules accepted: Orders

## 2021-04-05 NOTE — Telephone Encounter (Signed)
Okay to drop orders?

## 2021-04-05 NOTE — Telephone Encounter (Signed)
Pt needs an order for [facility] for school . I have scheduled him for Monday 08/22.

## 2021-04-07 ENCOUNTER — Encounter: Payer: Self-pay | Admitting: Medical

## 2021-04-10 ENCOUNTER — Other Ambulatory Visit (INDEPENDENT_AMBULATORY_CARE_PROVIDER_SITE_OTHER): Payer: 59

## 2021-04-10 ENCOUNTER — Other Ambulatory Visit: Payer: Self-pay

## 2021-04-10 DIAGNOSIS — Z111 Encounter for screening for respiratory tuberculosis: Secondary | ICD-10-CM

## 2021-04-14 LAB — QUANTIFERON-TB GOLD PLUS
Mitogen-NIL: >10 IU/mL
NIL: 0.04 IU/mL
QuantiFERON-TB Gold Plus: NEGATIVE
TB1-NIL: 0.01 IU/mL
TB2-NIL: 0.01 IU/mL

## 2021-04-26 ENCOUNTER — Other Ambulatory Visit: Payer: Self-pay | Admitting: Medical

## 2021-04-27 ENCOUNTER — Other Ambulatory Visit (HOSPITAL_BASED_OUTPATIENT_CLINIC_OR_DEPARTMENT_OTHER): Payer: Self-pay

## 2021-04-27 MED ORDER — VENLAFAXINE HCL ER 37.5 MG PO CP24
37.5000 mg | ORAL_CAPSULE | Freq: Every day | ORAL | 1 refills | Status: DC
  Filled 2021-04-27: qty 90, 90d supply, fill #0
  Filled 2021-07-06 – 2021-07-10 (×2): qty 90, 90d supply, fill #1

## 2021-05-03 ENCOUNTER — Other Ambulatory Visit: Payer: Self-pay

## 2021-05-03 ENCOUNTER — Ambulatory Visit (INDEPENDENT_AMBULATORY_CARE_PROVIDER_SITE_OTHER): Payer: 59

## 2021-05-03 DIAGNOSIS — Z23 Encounter for immunization: Secondary | ICD-10-CM | POA: Diagnosis not present

## 2021-05-29 ENCOUNTER — Other Ambulatory Visit (HOSPITAL_BASED_OUTPATIENT_CLINIC_OR_DEPARTMENT_OTHER): Payer: Self-pay

## 2021-07-06 ENCOUNTER — Other Ambulatory Visit: Payer: Self-pay | Admitting: Medical

## 2021-07-06 ENCOUNTER — Other Ambulatory Visit (HOSPITAL_BASED_OUTPATIENT_CLINIC_OR_DEPARTMENT_OTHER): Payer: Self-pay

## 2021-07-06 MED ORDER — FENOFIBRATE 48 MG PO TABS
48.0000 mg | ORAL_TABLET | Freq: Every day | ORAL | 1 refills | Status: DC
  Filled 2021-07-06: qty 90, 90d supply, fill #0
  Filled 2021-10-11: qty 90, 90d supply, fill #1

## 2021-07-06 MED ORDER — CARESTART COVID-19 HOME TEST VI KIT
PACK | 0 refills | Status: DC
  Filled 2021-07-06: qty 2, 4d supply, fill #0

## 2021-07-10 ENCOUNTER — Other Ambulatory Visit (HOSPITAL_BASED_OUTPATIENT_CLINIC_OR_DEPARTMENT_OTHER): Payer: Self-pay

## 2021-07-17 ENCOUNTER — Other Ambulatory Visit (HOSPITAL_BASED_OUTPATIENT_CLINIC_OR_DEPARTMENT_OTHER): Payer: Self-pay

## 2021-10-11 ENCOUNTER — Other Ambulatory Visit (HOSPITAL_BASED_OUTPATIENT_CLINIC_OR_DEPARTMENT_OTHER): Payer: Self-pay

## 2021-10-17 ENCOUNTER — Other Ambulatory Visit (HOSPITAL_BASED_OUTPATIENT_CLINIC_OR_DEPARTMENT_OTHER): Payer: Self-pay

## 2021-10-17 ENCOUNTER — Telehealth: Payer: 59 | Admitting: Physician Assistant

## 2021-10-17 DIAGNOSIS — L309 Dermatitis, unspecified: Secondary | ICD-10-CM

## 2021-10-17 MED ORDER — TRIAMCINOLONE ACETONIDE 0.1 % EX CREA
1.0000 | TOPICAL_CREAM | Freq: Two times a day (BID) | CUTANEOUS | 0 refills | Status: DC
Start: 2021-10-17 — End: 2022-08-09
  Filled 2021-10-17: qty 30, 15d supply, fill #0

## 2021-10-17 NOTE — Progress Notes (Signed)
I have spent 5 minutes in review of e-visit questionnaire, review and updating patient chart, medical decision making and response to patient.   Toron Bowring Cody Kelbie Moro, PA-C    

## 2021-10-17 NOTE — Progress Notes (Signed)

## 2022-01-05 ENCOUNTER — Telehealth: Payer: 59 | Admitting: Emergency Medicine

## 2022-01-05 ENCOUNTER — Other Ambulatory Visit (HOSPITAL_BASED_OUTPATIENT_CLINIC_OR_DEPARTMENT_OTHER): Payer: Self-pay

## 2022-01-05 DIAGNOSIS — R21 Rash and other nonspecific skin eruption: Secondary | ICD-10-CM | POA: Diagnosis not present

## 2022-01-05 MED ORDER — PREDNISONE 20 MG PO TABS
40.0000 mg | ORAL_TABLET | Freq: Every day | ORAL | 0 refills | Status: DC
  Filled 2022-01-05: qty 12, 6d supply, fill #0

## 2022-01-05 NOTE — Progress Notes (Signed)
E Visit for Rash  We are sorry that you are not feeling well. Here is how we plan to help!  Based on what you shared with me it looks like you have contact dermatitis.  Contact dermatitis is a skin rash caused by something that touches the skin and causes irritation or inflammation.  Your skin may be red, swollen, dry, cracked, and itch.  The rash should go away in a few days but can last a few weeks.  If you get a rash, it's important to figure out what caused it so the irritant can be avoided in the future.   I've prescribed a prednisone taper for you.  If you don't start to get improvement with this, you'll need to be seen in-person to ensure that we have the right diagnosis.  HOME CARE:  Take cool showers and avoid direct sunlight. Apply cool compress or wet dressings. Take a bath in an oatmeal bath.  Sprinkle content of one Aveeno packet under running faucet with comfortably warm water.  Bathe for 15-20 minutes, 1-2 times daily.  Pat dry with a towel. Do not rub the rash. Use hydrocortisone cream. Take an antihistamine like Benadryl for widespread rashes that itch.  The adult dose of Benadryl is 25-50 mg by mouth 4 times daily. Caution:  This type of medication may cause sleepiness.  Do not drink alcohol, drive, or operate dangerous machinery while taking antihistamines.  Do not take these medications if you have prostate enlargement.  Read package instructions thoroughly on all medications that you take.  GET HELP RIGHT AWAY IF:  Symptoms don't go away after treatment. Severe itching that persists. If you rash spreads or swells. If you rash begins to smell. If it blisters and opens or develops a yellow-brown crust. You develop a fever. You have a sore throat. You become short of breath.  MAKE SURE YOU:  Understand these instructions. Will watch your condition. Will get help right away if you are not doing well or get worse.  Thank you for choosing an e-visit.  Your e-visit  answers were reviewed by a board certified advanced clinical practitioner to complete your personal care plan. Depending upon the condition, your plan could have included both over the counter or prescription medications.  Please review your pharmacy choice. Make sure the pharmacy is open so you can pick up prescription now. If there is a problem, you may contact your provider through [employer] and have the prescription routed to another pharmacy.  Your safety is important to [place]. If you have drug allergies check your prescription carefully.   For the next 24 hours you can use MyChart to ask questions about today's visit, request a non-urgent call back, or ask for a work or school excuse. You will get an email in the next two days asking about your experience. I hope that your e-visit has been valuable and will speed your recovery.  Approximately 5 minutes was used in reviewing the patient's chart, questionnaire, prescribing medications, and documentation.

## 2022-03-12 ENCOUNTER — Telehealth: Payer: Self-pay | Admitting: Medical

## 2022-03-12 NOTE — Addendum Note (Signed)
Addended by: Anabel Halon on: 03/12/2022 08:29 PM   Modules accepted: Orders

## 2022-03-12 NOTE — Addendum Note (Signed)
Addended by: Anabel Halon on: 03/12/2022 08:27 PM   Modules accepted: Orders

## 2022-03-12 NOTE — Telephone Encounter (Signed)
Pt is needing a tuberculosis blood test for work and also he stated his work told him he needed three shots to complete hep b but we only did two so he is not sure if they may have been referring to an outdated test. If he is up to date on all hep b shots he asked if there was some type of letter we could send to verify this. He stated if this needs to be further discussed a mychart message could be sent.

## 2022-03-13 NOTE — Telephone Encounter (Signed)
Pt called and lvm to return call to schedule Lab

## 2022-03-14 ENCOUNTER — Other Ambulatory Visit (INDEPENDENT_AMBULATORY_CARE_PROVIDER_SITE_OTHER): Payer: 59

## 2022-03-14 DIAGNOSIS — Z0184 Encounter for antibody response examination: Secondary | ICD-10-CM | POA: Diagnosis not present

## 2022-03-14 DIAGNOSIS — Z111 Encounter for screening for respiratory tuberculosis: Secondary | ICD-10-CM | POA: Diagnosis not present

## 2022-03-15 LAB — HEPATITIS B SURFACE ANTIBODY, QUANTITATIVE: Hep B S AB Quant (Post): 197 m[IU]/mL (ref >=10)

## 2022-03-17 LAB — QUANTIFERON-TB GOLD PLUS
Mitogen-NIL: >10 IU/mL
NIL: 0.11 IU/mL
QuantiFERON-TB Gold Plus: NEGATIVE
TB1-NIL: <0 IU/mL
TB2-NIL: <0 IU/mL

## 2022-05-24 ENCOUNTER — Other Ambulatory Visit (HOSPITAL_BASED_OUTPATIENT_CLINIC_OR_DEPARTMENT_OTHER): Payer: Self-pay

## 2022-05-24 MED ORDER — FLUARIX QUADRIVALENT 0.5 ML IM SUSY
PREFILLED_SYRINGE | INTRAMUSCULAR | 0 refills | Status: DC
  Filled 2022-05-24: qty 0.5, 1d supply, fill #0

## 2022-08-09 ENCOUNTER — Telehealth: Payer: 59 | Admitting: Physician Assistant

## 2022-08-09 ENCOUNTER — Other Ambulatory Visit (HOSPITAL_BASED_OUTPATIENT_CLINIC_OR_DEPARTMENT_OTHER): Payer: Self-pay

## 2022-08-09 DIAGNOSIS — R6889 Other general symptoms and signs: Secondary | ICD-10-CM

## 2022-08-09 MED ORDER — OSELTAMIVIR PHOSPHATE 75 MG PO CAPS
75.0000 mg | ORAL_CAPSULE | Freq: Two times a day (BID) | ORAL | 0 refills | Status: DC
  Filled 2022-08-09: qty 10, 5d supply, fill #0

## 2022-08-09 NOTE — Progress Notes (Signed)
E visit for Flu like symptoms   We are sorry that you are not feeling well.  Here is how we plan to help! Based on what you have shared with me it looks like you may have a respiratory virus that may be influenza.  Influenza or "the flu" is   an infection caused by a respiratory virus. The flu virus is highly contagious and persons who did not receive their yearly flu vaccination may "catch" the flu from close contact.  We have anti-viral medications to treat the viruses that cause this infection. They are not a "cure" and only shorten the course of the infection. These prescriptions are most effective when they are given within the first 2 days of "flu" symptoms. Antiviral medication are indicated if you have a high risk of complications from the flu. You should  also consider an antiviral medication if you are in close contact with someone who is at risk. These medications can help patients avoid complications from the flu  but have side effects that you should know. Possible side effects from Tamiflu or oseltamivir include nausea, vomiting, diarrhea, dizziness, headaches, eye redness, sleep problems or other respiratory symptoms. You should not take Tamiflu if you have an allergy to oseltamivir or any to the ingredients in Tamiflu.  Based upon your symptoms and potential risk factors I have prescribed Oseltamivir (Tamiflu).  It has been sent to your designated pharmacy.  You will take one 75 mg capsule orally twice a day for the next 5 days. and I recommend that you follow the flu symptoms recommendation that I have listed below.  Please keep well-hydrated and try to get plenty of rest. If you have a humidifier, place it in the bedroom and run it at night. Start a saline nasal rinse for nasal congestion. You can consider use of a nasal steroid spray like Flonase or Nasacort OTC. You can alternate between Tylenol and Ibuprofen if needed for fever, body aches, headache and/or throat pain. Salt  water-gargles and chloraseptic spray can be very beneficial for sore throat. Mucinex-DM for congestion or cough. Please take all prescribed medications as directed.  Remain out of work until fever-free for 24 hours without a fever-reducing medication, and you are feeling better.  You should mask until symptoms are resolved.  If anything worsens despite treatment, you need to be evaluated in-person. Please do not delay care.   ANYONE WHO HAS FLU SYMPTOMS SHOULD: Stay home. The flu is highly contagious and going out or to work exposes others! Be sure to drink plenty of fluids. Water is fine as well as fruit juices, sodas and electrolyte beverages. You may want to stay away from caffeine or alcohol. If you are nauseated, try taking small sips of liquids. How do you know if you are getting enough fluid? Your urine should be a pale yellow or almost colorless. Get rest. Taking a steamy shower or using a humidifier may help nasal congestion and ease sore throat pain. Using a saline nasal spray works much the same way. Cough drops, hard candies and sore throat lozenges may ease your cough. Line up a caregiver. Have someone check on you regularly.   GET HELP RIGHT AWAY IF: You cannot keep down liquids or your medications. You become short of breath Your fell like you are going to pass out or loose consciousness. Your symptoms persist after you have completed your treatment plan MAKE SURE YOU  Understand these instructions. Will watch your condition. Will get help right   away if you are not doing well or get worse.  Your e-visit answers were reviewed by a board certified advanced clinical practitioner to complete your personal care plan.  Depending on the condition, your plan could have included both over the counter or prescription medications.  If there is a problem please reply  once you have received a response from your provider.  Your safety is important to us.  If you have drug allergies  check your prescription carefully.    You can use MyChart to ask questions about today's visit, request a non-urgent call back, or ask for a work or school excuse for 24 hours related to this e-Visit. If it has been greater than 24 hours you will need to follow up with your provider, or enter a new e-Visit to address those concerns.  You will get an e-mail in the next two days asking about your experience.  I hope that your e-visit has been valuable and will speed your recovery. Thank you for using e-visits.  

## 2022-08-09 NOTE — Progress Notes (Signed)
I have spent 5 minutes in review of e-visit questionnaire, review and updating patient chart, medical decision making and response to patient.   Jeliyah Middlebrooks Cody Ivah Girardot, PA-C    

## 2023-04-12 ENCOUNTER — Ambulatory Visit (INDEPENDENT_AMBULATORY_CARE_PROVIDER_SITE_OTHER): Payer: Commercial Managed Care - PPO | Admitting: Medical

## 2023-04-12 VITALS — BP 139/80 | HR 62 | Temp 98.5°F | Resp 18 | Ht 71.0 in | Wt 295.6 lb

## 2023-04-12 DIAGNOSIS — Z0000 Encounter for general adult medical examination without abnormal findings: Secondary | ICD-10-CM

## 2023-04-12 DIAGNOSIS — R748 Abnormal levels of other serum enzymes: Secondary | ICD-10-CM

## 2023-04-12 DIAGNOSIS — Z13220 Encounter for screening for lipoid disorders: Secondary | ICD-10-CM

## 2023-04-12 LAB — COMPREHENSIVE METABOLIC PANEL
ALT: 66 U/L — ABNORMAL HIGH (ref 0–53)
AST: 38 U/L — ABNORMAL HIGH (ref 0–37)
Albumin: 4.1 g/dL (ref 3.5–5.2)
Alkaline Phosphatase: 93 U/L (ref 39–117)
BUN: 10 mg/dL (ref 6–23)
CO2: 30 mEq/L (ref 19–32)
Calcium: 9.5 mg/dL (ref 8.4–10.5)
Chloride: 101 mEq/L (ref 96–112)
Creatinine, Ser: 0.82 mg/dL (ref 0.40–1.50)
GFR: 109.53 mL/min (ref >60.00)
Glucose, Bld: 88 mg/dL (ref 70–99)
Potassium: 4.6 mEq/L (ref 3.5–5.1)
Sodium: 138 mEq/L (ref 135–145)
Total Bilirubin: 0.7 mg/dL (ref 0.2–1.2)
Total Protein: 7.3 g/dL (ref 6.0–8.3)

## 2023-04-12 LAB — CBC WITH DIFFERENTIAL/PLATELET
Basophils Absolute: 0 10*3/uL (ref 0.0–0.1)
Basophils Relative: 0.8 % (ref 0.0–3.0)
Eosinophils Absolute: 0.2 10*3/uL (ref 0.0–0.7)
Eosinophils Relative: 4 % (ref 0.0–5.0)
HCT: 49.1 % (ref 39.0–52.0)
Hemoglobin: 16.2 g/dL (ref 13.0–17.0)
Lymphocytes Relative: 37.5 % (ref 12.0–46.0)
Lymphs Abs: 2.2 10*3/uL (ref 0.7–4.0)
MCHC: 33 g/dL (ref 30.0–36.0)
MCV: 92.1 fl (ref 78.0–100.0)
Monocytes Absolute: 0.5 10*3/uL (ref 0.1–1.0)
Monocytes Relative: 8 % (ref 3.0–12.0)
Neutro Abs: 2.9 10*3/uL (ref 1.4–7.7)
Neutrophils Relative %: 49.7 % (ref 43.0–77.0)
Platelets: 207 10*3/uL (ref 150.0–400.0)
RBC: 5.33 Mil/uL (ref 4.22–5.81)
RDW: 13.8 % (ref 11.5–15.5)
WBC: 5.8 10*3/uL (ref 4.0–10.5)

## 2023-04-12 LAB — LIPID PANEL
Cholesterol: 199 mg/dL (ref 0–200)
HDL: 36.6 mg/dL — ABNORMAL LOW (ref >39.00)
LDL Cholesterol: 133 mg/dL — ABNORMAL HIGH (ref 0–99)
NonHDL: 162.27
Total CHOL/HDL Ratio: 5
Triglycerides: 145 mg/dL (ref 0.0–149.0)
VLDL: 29 mg/dL (ref 0.0–40.0)

## 2023-04-12 NOTE — Progress Notes (Signed)
Subjective:    Patient ID: Glenn Mendoza, male    DOB: 07-03-1981, 42 y.o.   MRN: [id number]  HPI  Pt in for wellness exam today. He finished radiology program.   He states he does walk around neighbood twice a week. Pt has been working out. No sodas or sweet teas.  Coffee -23 cups a day. Not smoking. Former vaper but stopped.  Pt will get flu vaccine thru work.   Borderline bp today. Pt has not been checking his bp. Pt planning to lose weight and eat less salt. Has gained wt while in radiology program.    Review of Systems  Constitutional:  Negative for chills, fatigue and fever.  HENT:  Negative for congestion and facial swelling.   Respiratory:  Negative for cough, chest tightness, shortness of breath and wheezing.   Cardiovascular:  Negative for chest pain and palpitations.  Gastrointestinal:  Negative for abdominal pain, blood in stool and constipation.  Musculoskeletal:  Negative for back pain, joint swelling and neck pain.  Skin:  Negative for rash.  Neurological:  Negative for dizziness, speech difficulty, weakness and light-headedness.  Hematological:  Negative for adenopathy. Does not bruise/bleed easily.  Psychiatric/Behavioral:  Negative for behavioral problems and decreased concentration. The patient is not nervous/anxious.     Past Medical History:  Diagnosis Date   Acid reflux    Allergy    Anxiety    Depression    PTSD (post-traumatic stress disorder)      Social History   Socioeconomic History   Marital status: Married    Spouse name: Not on file   Number of children: Not on file   Years of education: Not on file   Highest education level: Associate degree: academic program  Occupational History   Not on file  Tobacco Use   Smoking status: Former    Current packs/day: 0.50    Average packs/day: 0.5 packs/day for 0.2 years (0.1 ttl pk-yrs)    Types: Cigarettes   Smokeless tobacco: Never  Substance and Sexual Activity   Alcohol use: No   Drug  use: Not on file   Sexual activity: Yes  Other Topics Concern   Not on file  Social History Narrative   Not on file   Social Determinants of Health   Financial Resource Strain: Low Risk  (04/12/2023)   Overall Financial Resource Strain (CARDIA)    Difficulty of Paying Living Expenses: Not hard at all  Food Insecurity: No Food Insecurity (04/12/2023)   Hunger Vital Sign    Worried About Running Out of Food in the Last Year: Never true    Ran Out of Food in the Last Year: Never true  Transportation Needs: No Transportation Needs (04/12/2023)   PRAPARE - Administrator, Civil Service (Medical): No    Lack of Transportation (Non-Medical): No  Physical Activity: Insufficiently Active (04/12/2023)   Exercise Vital Sign    Days of Exercise per Week: 1 day    Minutes of Exercise per Session: 30 min  Stress: Stress Concern Present (04/12/2023)   [employer] of Occupational Health - Occupational Stress Questionnaire    Feeling of Stress : To some extent  Social Connections: Socially Isolated (04/12/2023)   Social Connection and Isolation Panel [NHANES]    Frequency of Communication with Friends and Family: Never    Frequency of Social Gatherings with Friends and Family: Once a week    Attends Religious Services: Never    Database administrator or  Organizations: No    Attends Engineer, structural: Not on file    Marital Status: Married  Catering manager Violence: Not on file    No past surgical history on file.  Family History  Problem Relation Age of Onset   Depression Mother    Diabetes Mother    Alcohol abuse Father    Hypertension Father    Alcohol abuse Sister    Alcohol abuse Brother     Allergies  Allergen Reactions   Wellbutrin [Bupropion] Hives    On palms and soles   Codeine Itching and Anxiety    No current outpatient medications on file prior to visit.   No current facility-administered medications on file prior to visit.    BP 139/80    Pulse 62   Temp 98.5 F (36.9 C) (Oral)   Resp 18   Ht 5\' 11"  (1.803 m)   Wt 295 lb 9.6 oz (134.1 kg)   SpO2 98%   BMI 41.23 kg/m          Objective:   Physical Exam  General Mental Status- Alert. General Appearance- Not in acute distress.   Skin General: Color- Normal Color. Moisture- Normal Moisture.  Neck Carotid Arteries- Normal color. Moisture- Normal Moisture. No carotid bruits. No JVD.  Chest and Lung Exam Auscultation: Breath Sounds:-Normal.  Cardiovascular Auscultation:Rythm- Regular. Murmurs & Other Heart Sounds:Auscultation of the heart reveals- No Murmurs.  Abdomen Inspection:-Inspeection Normal. Palpation/Percussion:Note:No mass. Palpation and Percussion of the abdomen reveal- Non Tender, Non Distended + BS, no rebound or guarding.  Neurologic Cranial Nerve exam:- CN III-XII intact(No nystagmus), symmetric smile. Strength:- 5/5 equal and symmetric strength both upper and lower extremities.       Assessment & Plan:   Patient Instructions  For you wellness exam today I have ordered cbc, cmp and lipid panel.  Flu vaccine thru work. Consider covid vaccine as well.  Recommend exercise and healthy diet.  We will let you know lab results as they come in.  Follow up date appointment will be determined after lab review.    BP is borderline. Recommend low salt diet, more exercise, weight loss and check bp at home when relaxed. Want bp to be closer to 130/80.     Esperanza Richters, PA-C

## 2023-04-12 NOTE — Patient Instructions (Addendum)
For you wellness exam today I have ordered cbc, cmp and lipid panel.  Flu vaccine thru work. Consider covid vaccine as well.  Recommend exercise and healthy diet.  We will let you know lab results as they come in.  Follow up date appointment will be determined after lab review.    BP is borderline. Recommend low salt diet, more exercise, weight loss and check bp at home when relaxed. Want bp to be closer to 130/80.  Preventive Care 61-41 Years Old, Male Preventive care refers to lifestyle choices and visits with your health care provider that can promote health and wellness. Preventive care visits are also called wellness exams. What can I expect for my preventive care visit? Counseling During your preventive care visit, your health care provider may ask about your: Medical history, including: Past medical problems. Family medical history. Current health, including: Emotional well-being. Home life and relationship well-being. Sexual activity. Lifestyle, including: Alcohol, nicotine or tobacco, and drug use. Access to firearms. Diet, exercise, and sleep habits. Safety issues such as seatbelt and bike helmet use. Sunscreen use. Work and work Astronomer. Physical exam Your health care provider will check your: Height and weight. These may be used to calculate your BMI (body mass index). BMI is a measurement that tells if you are at a healthy weight. Waist circumference. This measures the distance around your waistline. This measurement also tells if you are at a healthy weight and may help predict your risk of certain diseases, such as type 2 diabetes and high blood pressure. Heart rate and blood pressure. Body temperature. Skin for abnormal spots. What immunizations do I need?  Vaccines are usually given at various ages, according to a schedule. Your health care provider will recommend vaccines for you based on your age, medical history, and lifestyle or other factors, such as  travel or where you work. What tests do I need? Screening Your health care provider may recommend screening tests for certain conditions. This may include: Lipid and cholesterol levels. Diabetes screening. This is done by checking your blood sugar (glucose) after you have not eaten for a while (fasting). Hepatitis B test. Hepatitis C test. HIV (human immunodeficiency virus) test. STI (sexually transmitted infection) testing, if you are at risk. Lung cancer screening. Prostate cancer screening. Colorectal cancer screening. Talk with your health care provider about your test results, treatment options, and if necessary, the need for more tests. Follow these instructions at home: Eating and drinking  Eat a diet that includes fresh fruits and vegetables, whole grains, lean protein, and low-fat dairy products. Take vitamin and mineral supplements as recommended by your health care provider. Do not drink alcohol if your health care provider tells you not to drink. If you drink alcohol: Limit how much you have to 0-2 drinks a day. Know how much alcohol is in your drink. In the [place] one drink equals one 12 oz bottle of beer (355 mL), one 5 oz glass of wine (148 mL), or one 1 oz glass of hard liquor (44 mL). Lifestyle Brush your teeth every morning and night with fluoride toothpaste. Floss one time each day. Exercise for at least 30 minutes 5 or more days each week. Do not use any products that contain nicotine or tobacco. These products include cigarettes, chewing tobacco, and vaping devices, such as e-cigarettes. If you need help quitting, ask your health care provider. Do not use drugs. If you are sexually active, practice safe sex. Use a condom or other form of protection  to prevent STIs. Take aspirin only as told by your health care provider. Make sure that you understand how much to take and what form to take. Work with your health care provider to find out whether it is safe and  beneficial for you to take aspirin daily. Find healthy ways to manage stress, such as: Meditation, yoga, or listening to music. Journaling. Talking to a trusted person. Spending time with friends and family. Minimize exposure to UV radiation to reduce your risk of skin cancer. Safety Always wear your seat belt while driving or riding in a vehicle. Do not drive: If you have been drinking alcohol. Do not ride with someone who has been drinking. When you are tired or distracted. While texting. If you have been using any mind-altering substances or drugs. Wear a helmet and other protective equipment during sports activities. If you have firearms in your house, make sure you follow all gun safety procedures. What's next? Go to your health care provider once a year for an annual wellness visit. Ask your health care provider how often you should have your eyes and teeth checked. Stay up to date on all vaccines. This information is not intended to replace advice given to you by your health care provider. Make sure you discuss any questions you have with your health care provider. Document Revised: 02/01/2021 Document Reviewed: 02/01/2021 Elsevier Patient Education  2024 [employer].

## 2023-04-13 NOTE — Addendum Note (Signed)
Addended by: Gwenevere Abbot on: 04/13/2023 07:13 AM   Modules accepted: Orders

## 2023-04-16 ENCOUNTER — Encounter: Payer: Commercial Managed Care - PPO | Admitting: Medical

## 2023-04-26 ENCOUNTER — Ambulatory Visit (INDEPENDENT_AMBULATORY_CARE_PROVIDER_SITE_OTHER): Payer: Commercial Managed Care - PPO

## 2023-04-26 ENCOUNTER — Other Ambulatory Visit (HOSPITAL_BASED_OUTPATIENT_CLINIC_OR_DEPARTMENT_OTHER): Payer: Commercial Managed Care - PPO

## 2023-04-26 DIAGNOSIS — R748 Abnormal levels of other serum enzymes: Secondary | ICD-10-CM | POA: Diagnosis not present

## 2023-04-26 DIAGNOSIS — R7989 Other specified abnormal findings of blood chemistry: Secondary | ICD-10-CM | POA: Diagnosis not present

## 2023-05-03 ENCOUNTER — Telehealth: Payer: Commercial Managed Care - PPO | Admitting: Physician Assistant

## 2023-05-03 ENCOUNTER — Other Ambulatory Visit (HOSPITAL_BASED_OUTPATIENT_CLINIC_OR_DEPARTMENT_OTHER): Payer: Self-pay

## 2023-05-03 DIAGNOSIS — U071 COVID-19: Secondary | ICD-10-CM

## 2023-05-03 MED ORDER — NIRMATRELVIR/RITONAVIR (PAXLOVID)TABLET
3.0000 | ORAL_TABLET | Freq: Two times a day (BID) | ORAL | 0 refills | Status: AC
Start: 2023-05-03 — End: 2023-05-08
  Filled 2023-05-03: qty 30, 5d supply, fill #0

## 2023-05-03 NOTE — Patient Instructions (Signed)
Glenn Mendoza, thank you for joining Glenn Loveless, PA-C for today's virtual visit.  While this provider is not your primary care provider (PCP), if your PCP is located in our provider database this encounter information will be shared with them immediately following your visit.   A [facility] MyChart account gives you access to today's visit and all your visits, tests, and labs performed at [facility] " click here if you don't have a [facility] MyChart account or go to [email or web address]  Consent: (Patient) Glenn Mendoza provided verbal consent for this virtual visit at the beginning of the encounter.  Current Medications:  Current Outpatient Medications:    nirmatrelvir/ritonavir (PAXLOVID) 20 x 150 MG & 10 x 100MG  TABS, Take 3 tablets by mouth 2 (two) times daily for 5 days. (Take nirmatrelvir 150 mg two tablets twice daily for 5 days and ritonavir 100 mg one tablet twice daily for 5 days) Patient GFR is 109, Disp: 30 tablet, Rfl: 0   Medications ordered in this encounter:  Meds ordered this encounter  Medications   nirmatrelvir/ritonavir (PAXLOVID) 20 x 150 MG & 10 x 100MG  TABS    Sig: Take 3 tablets by mouth 2 (two) times daily for 5 days. (Take nirmatrelvir 150 mg two tablets twice daily for 5 days and ritonavir 100 mg one tablet twice daily for 5 days) Patient GFR is 109    Dispense:  30 tablet    Refill:  0    Order Specific Question:   Supervising Provider    Answer:   Merrilee Jansky [id number]     *If you need refills on other medications prior to your next appointment, please contact your pharmacy*  Follow-Up: Call back or seek an in-person evaluation if the symptoms worsen or if the condition fails to improve as anticipated.  [facility] [phone]  Care Instructions: Can take to lessen severity: Vit C 500mg  twice daily Quercertin 250-500mg  twice daily Zinc 75-100mg  daily Melatonin 3-6 mg at bedtime Vit D3 1000-2000 IU  daily Aspirin 81 mg daily with food Optional: Famotidine 20mg  daily Also can add tylenol/ibuprofen as needed for fevers and body aches May add Mucinex or Mucinex DM as needed for cough/congestion    Isolation Instructions: You are to isolate at home until you have been fever free for at least 24 hours without a fever-reducing medication, and symptoms have been steadily improving for 24 hours. At that time,  you can end isolation but need to mask for an additional 5 days.   If you must be around other household members who do not have symptoms, you need to make sure that both you and the family members are masking consistently with a high-quality mask.  If you note any worsening of symptoms despite treatment, please seek an in-person evaluation ASAP. If you note any significant shortness of breath or any chest pain, please seek ER evaluation. Please do not delay care!   COVID-19: What to Do if You Are Sick If you test positive and are an older adult or someone who is at high risk of getting very sick from COVID-19, treatment may be available. Contact a healthcare provider right away after a positive test to determine if you are eligible, even if your symptoms are mild right now. You can also visit a Test to Treat location and, if eligible, receive a prescription from a provider. Don't delay: Treatment must be started within the first few days to be effective. If you have a  fever, cough, or other symptoms, you might have COVID-19. Most people have mild illness and are able to recover at home. If you are sick: Keep track of your symptoms. If you have an emergency warning sign (including trouble breathing), call 911. Steps to help prevent the spread of COVID-19 if you are sick If you are sick with COVID-19 or think you might have COVID-19, follow the steps below to care for yourself and to help protect other people in your home and community. Stay home except to get medical care Stay home. Most  people with COVID-19 have mild illness and can recover at home without medical care. Do not leave your home, except to get medical care. Do not visit public areas and do not go to places where you are unable to wear a mask. Take care of yourself. Get rest and stay hydrated. Take over-the-counter medicines, such as acetaminophen, to help you feel better. Stay in touch with your doctor. Call before you get medical care. Be sure to get care if you have trouble breathing, or have any other emergency warning signs, or if you think it is an emergency. Avoid public transportation, ride-sharing, or taxis if possible. Get tested If you have symptoms of COVID-19, get tested. While waiting for test results, stay away from others, including staying apart from those living in your household. Get tested as soon as possible after your symptoms start. Treatments may be available for people with COVID-19 who are at risk for becoming very sick. Don't delay: Treatment must be started early to be effective--some treatments must begin within 5 days of your first symptoms. Contact your healthcare provider right away if your test result is positive to determine if you are eligible. Self-tests are one of several options for testing for the virus that causes COVID-19 and may be more convenient than laboratory-based tests and point-of-care tests. Ask your healthcare provider or your local health department if you need help interpreting your test results. You can visit your state, tribal, local, and territorial health department's website to look for the latest local information on testing sites. Separate yourself from other people As much as possible, stay in a specific room and away from other people and pets in your home. If possible, you should use a separate bathroom. If you need to be around other people or animals in or outside of the home, wear a well-fitting mask. Tell your close contacts that they may have been exposed to  COVID-19. An infected person can spread COVID-19 starting 48 hours (or 2 days) before the person has any symptoms or tests positive. By letting your close contacts know they may have been exposed to COVID-19, you are helping to protect everyone. See COVID-19 and Animals if you have questions about pets. If you are diagnosed with COVID-19, someone from the health department may call you. Answer the call to slow the spread. Monitor your symptoms Symptoms of COVID-19 include fever, cough, or other symptoms. Follow care instructions from your healthcare provider and local health department. Your local health authorities may give instructions on checking your symptoms and reporting information. When to seek emergency medical attention Look for emergency warning signs* for COVID-19. If someone is showing any of these signs, seek emergency medical care immediately: Trouble breathing Persistent pain or pressure in the chest New confusion Inability to wake or stay awake Pale, gray, or blue-colored skin, lips, or nail beds, depending on skin tone *This list is not all possible symptoms. Please call your medical  provider for any other symptoms that are severe or concerning to you. Call 911 or call ahead to your local emergency facility: Notify the operator that you are seeking care for someone who has or may have COVID-19. Call ahead before visiting your doctor Call ahead. Many medical visits for routine care are being postponed or done by phone or telemedicine. If you have a medical appointment that cannot be postponed, call your doctor's office, and tell them you have or may have COVID-19. This will help the office protect themselves and other patients. If you are sick, wear a well-fitting mask You should wear a mask if you must be around other people or animals, including pets (even at home). Wear a mask with the best fit, protection, and comfort for you. You don't need to wear the mask if you are  alone. If you can't put on a mask (because of trouble breathing, for example), cover your coughs and sneezes in some other way. Try to stay at least 6 feet away from other people. This will help protect the people around you. Masks should not be placed on young children under age 29 years, anyone who has trouble breathing, or anyone who is not able to remove the mask without help. Cover your coughs and sneezes Cover your mouth and nose with a tissue when you cough or sneeze. Throw away used tissues in a lined trash can. Immediately wash your hands with soap and water for at least 20 seconds. If soap and water are not available, clean your hands with an alcohol-based hand sanitizer that contains at least 60% alcohol. Clean your hands often Wash your hands often with soap and water for at least 20 seconds. This is especially important after blowing your nose, coughing, or sneezing; going to the bathroom; and before eating or preparing food. Use hand sanitizer if soap and water are not available. Use an alcohol-based hand sanitizer with at least 60% alcohol, covering all surfaces of your hands and rubbing them together until they feel dry. Soap and water are the best option, especially if hands are visibly dirty. Avoid touching your eyes, nose, and mouth with unwashed hands. Handwashing Tips Avoid sharing personal household items Do not share dishes, drinking glasses, cups, eating utensils, towels, or bedding with other people in your home. Wash these items thoroughly after using them with soap and water or put in the dishwasher. Clean surfaces in your home regularly Clean and disinfect high-touch surfaces (for example, doorknobs, tables, handles, light switches, and countertops) in your "sick room" and bathroom. In shared spaces, you should clean and disinfect surfaces and items after each use by the person who is ill. If you are sick and cannot clean, a caregiver or other person should only clean and  disinfect the area around you (such as your bedroom and bathroom) on an as needed basis. Your caregiver/other person should wait as long as possible (at least several hours) and wear a mask before entering, cleaning, and disinfecting shared spaces that you use. Clean and disinfect areas that may have blood, stool, or body fluids on them. Use household cleaners and disinfectants. Clean visible dirty surfaces with household cleaners containing soap or detergent. Then, use a household disinfectant. Use a product from [employer] List N: Disinfectants for Coronavirus (COVID-19). Be sure to follow the instructions on the label to ensure safe and effective use of the product. Many products recommend keeping the surface wet with a disinfectant for a certain period of time (look at "  contact time" on the product label). You may also need to wear personal protective equipment, such as gloves, depending on the directions on the product label. Immediately after disinfecting, wash your hands with soap and water for 20 seconds. For completed guidance on cleaning and disinfecting your home, visit Complete Disinfection Guidance. Take steps to improve ventilation at home Improve ventilation (air flow) at home to help prevent from spreading COVID-19 to other people in your household. Clear out COVID-19 virus particles in the air by opening windows, using air filters, and turning on fans in your home. Use this interactive tool to learn how to improve air flow in your home. When you can be around others after being sick with COVID-19 Deciding when you can be around others is different for different situations. Find out when you can safely end home isolation. For any additional questions about your care, contact your healthcare provider or state or local health department. 11/08/2020 Content source: [facility] ([facility]), Division of Viral Diseases This information is not intended  to replace advice given to you by your health care provider. Make sure you discuss any questions you have with your health care provider. Document Revised: 12/22/2020 Document Reviewed: 12/22/2020 Elsevier Patient Education  2022 [employer].     If you have been instructed to have an in-person evaluation today at a local Urgent Care facility, please use the link below. It will take you to a list of all of our available [facility], including address, phone number and hours of operation. Please do not delay care.  [facility]  If you or a family member do not have a primary care provider, use the link below to schedule a visit and establish care. When you choose a [facility] primary care physician or advanced practice provider, you gain a long-term partner in health. Find a Primary Care Provider  Learn more about [facility] in-office and virtual care options:  - Get Care Now'

## 2023-05-03 NOTE — Progress Notes (Signed)
   Thank you for the details you included in the comment boxes. Those details are very helpful in determining the best course of treatment for you and help [place] to provide the best care. Because you are Covid 19 positive and interested in antiviral treatment, we recommend that you convert this visit to a video visit in order for the provider to better assess what is going on.  The provider will be able to give you a more accurate diagnosis and treatment plan if we can more freely discuss your symptoms and with the addition of a virtual examination.   If you convert to a video visit, we will bill your insurance (similar to an office visit) and you will not be charged for this e-Visit. You will be able to stay at home and speak with the first available [facility] advanced practice provider. The link to do a video visit is in the drop down Menu tab of your Welcome screen in MyChart.  You will need to schedule the video visit. You can do this through one of two ways:  1) Go into your MyChart App and select the "Menu" button, then select the "Virtual Urgent Care Visit" then proceed scheduling -OR- 2) Go to [email or web address] and select "Get Started" under the Virtual Urgent Care option, select "View all options", then select the "Schedule on your Time" and proceed with scheduling.  Best Regards,  Daiva Nakayama, PA-C   I have spent 5 minutes in review of e-visit questionnaire, review and updating patient chart, medical decision making and response to patient.   [name], PA-C

## 2023-05-03 NOTE — Progress Notes (Signed)
Virtual Visit Consent   [name], you are scheduled for a virtual visit with a [facility] provider today. Just as with appointments in the office, your consent must be obtained to participate. Your consent will be active for this visit and any virtual visit you may have with one of our providers in the next 365 days. If you have a MyChart account, a copy of this consent can be sent to you electronically.  As this is a virtual visit, video technology does not allow for your provider to perform a traditional examination. This may limit your provider's ability to fully assess your condition. If your provider identifies any concerns that need to be evaluated in person or the need to arrange testing (such as labs, EKG, etc.), we will make arrangements to do so. Although advances in technology are sophisticated, we cannot ensure that it will always work on either your end or our end. If the connection with a video visit is poor, the visit may have to be switched to a telephone visit. With either a video or telephone visit, we are not always able to ensure that we have a secure connection.  By engaging in this virtual visit, you consent to the provision of healthcare and authorize for your insurance to be billed (if applicable) for the services provided during this visit. Depending on your insurance coverage, you may receive a charge related to this service.  I need to obtain your verbal consent now. Are you willing to proceed with your visit today? Glenn Mendoza has provided verbal consent on 05/03/2023 for a virtual visit (video or telephone). [name], PA-C  Date: 05/03/2023 3:00 PM  Virtual Visit via Video Note   I, [name], connected with  Glenn Mendoza  ([id number], [birth year]) on 05/03/23 at  3:00 PM EDT by a video-enabled telemedicine application and verified that I am speaking with the correct person using two identifiers.  Location: Patient: Virtual Visit Location  Patient: Home Provider: Virtual Visit Location Provider: Home Office   I discussed the limitations of evaluation and management by telemedicine and the availability of in person appointments. The patient expressed understanding and agreed to proceed.    History of Present Illness: Glenn Mendoza is a 42 y.o. who identifies as a male who was assigned male at birth, and is being seen today for Covid 27.  HPI: URI  This is a new problem. The current episode started yesterday (Tested positive on at home Covid 19 test). The problem has been gradually worsening. There has been no fever. Associated symptoms include congestion, coughing, diarrhea, headaches, rhinorrhea, sinus pain and a sore throat. Pertinent negatives include no ear pain, nausea, plugged ear sensation, vomiting or wheezing. Associated symptoms comments: Body aches, post nasal drainage, chills. Treatments tried: naproxen, decongestant. The treatment provided no relief.     Problems:  Patient Active Problem List   Diagnosis Date Noted   Wellness examination 01/19/2015   Back pain 11/15/2014   Generalized anxiety disorder 11/15/2014   Allergic rhinitis 11/15/2014   Depression 01/26/2013   Esophageal reflux 01/26/2013    Allergies:  Allergies  Allergen Reactions   Wellbutrin [Bupropion] Hives    On palms and soles   Codeine Itching and Anxiety   Medications: No current outpatient medications on file.  Observations/Objective: Patient is well-developed, well-nourished in no acute distress.  Resting comfortably at home.  Head is normocephalic, atraumatic.  No labored breathing.  Speech is clear and coherent with logical content.  Patient  is alert and oriented at baseline.    Assessment and Plan: 1. COVID-19  - Continue OTC symptomatic management of choice - Will send OTC vitamins and supplement information through AVS - Paxlovid prescribed - Patient enrolled in MyChart symptom monitoring - Push fluids - Rest as  needed - Discussed return precautions and when to seek in-person evaluation, sent via AVS as well   Follow Up Instructions: I discussed the assessment and treatment plan with the patient. The patient was provided an opportunity to ask questions and all were answered. The patient agreed with the plan and demonstrated an understanding of the instructions.  A copy of instructions were sent to the patient via MyChart unless otherwise noted below.    The patient was advised to call back or seek an in-person evaluation if the symptoms worsen or if the condition fails to improve as anticipated.  Time:  I spent 10 minutes with the patient via telehealth technology discussing the above problems/concerns.    [name], PA-C

## 2024-01-27 DIAGNOSIS — H5213 Myopia, bilateral: Secondary | ICD-10-CM | POA: Diagnosis not present

## 2024-01-27 DIAGNOSIS — H52223 Regular astigmatism, bilateral: Secondary | ICD-10-CM | POA: Diagnosis not present

## 2024-04-13 ENCOUNTER — Encounter: Payer: Self-pay | Admitting: Medical

## 2024-04-13 ENCOUNTER — Ambulatory Visit (INDEPENDENT_AMBULATORY_CARE_PROVIDER_SITE_OTHER): Payer: Commercial Managed Care - PPO | Admitting: Medical

## 2024-04-13 VITALS — BP 130/87 | HR 50 | Temp 98.0°F | Resp 18 | Ht 71.0 in | Wt 292.4 lb

## 2024-04-13 DIAGNOSIS — Z1159 Encounter for screening for other viral diseases: Secondary | ICD-10-CM | POA: Diagnosis not present

## 2024-04-13 DIAGNOSIS — Z0184 Encounter for antibody response examination: Secondary | ICD-10-CM

## 2024-04-13 DIAGNOSIS — L989 Disorder of the skin and subcutaneous tissue, unspecified: Secondary | ICD-10-CM | POA: Diagnosis not present

## 2024-04-13 DIAGNOSIS — Z1283 Encounter for screening for malignant neoplasm of skin: Secondary | ICD-10-CM

## 2024-04-13 DIAGNOSIS — Z0000 Encounter for general adult medical examination without abnormal findings: Secondary | ICD-10-CM | POA: Diagnosis not present

## 2024-04-13 DIAGNOSIS — L918 Other hypertrophic disorders of the skin: Secondary | ICD-10-CM | POA: Diagnosis not present

## 2024-04-13 LAB — LIPID PANEL
Cholesterol: 187 mg/dL (ref 0–200)
HDL: 33.9 mg/dL — ABNORMAL LOW (ref >39.00)
LDL Cholesterol: 118 mg/dL — ABNORMAL HIGH (ref 0–99)
NonHDL: 153.07
Total CHOL/HDL Ratio: 6
Triglycerides: 175 mg/dL — ABNORMAL HIGH (ref 0.0–149.0)
VLDL: 35 mg/dL (ref 0.0–40.0)

## 2024-04-13 LAB — COMPREHENSIVE METABOLIC PANEL WITH GFR
ALT: 38 U/L (ref 0–53)
AST: 22 U/L (ref 0–37)
Albumin: 4.4 g/dL (ref 3.5–5.2)
Alkaline Phosphatase: 98 U/L (ref 39–117)
BUN: 11 mg/dL (ref 6–23)
CO2: 29 meq/L (ref 19–32)
Calcium: 9.4 mg/dL (ref 8.4–10.5)
Chloride: 101 meq/L (ref 96–112)
Creatinine, Ser: 0.79 mg/dL (ref 0.40–1.50)
GFR: 109.99 mL/min (ref >60.00)
Glucose, Bld: 92 mg/dL (ref 70–99)
Potassium: 4.2 meq/L (ref 3.5–5.1)
Sodium: 140 meq/L (ref 135–145)
Total Bilirubin: 0.6 mg/dL (ref 0.2–1.2)
Total Protein: 7.7 g/dL (ref 6.0–8.3)

## 2024-04-13 LAB — CBC WITH DIFFERENTIAL/PLATELET
Basophils Absolute: 0.1 K/uL (ref 0.0–0.1)
Basophils Relative: 0.9 % (ref 0.0–3.0)
Eosinophils Absolute: 0.2 K/uL (ref 0.0–0.7)
Eosinophils Relative: 4 % (ref 0.0–5.0)
HCT: 50.3 % (ref 39.0–52.0)
Hemoglobin: 17.1 g/dL — ABNORMAL HIGH (ref 13.0–17.0)
Lymphocytes Relative: 37.3 % (ref 12.0–46.0)
Lymphs Abs: 2.2 K/uL (ref 0.7–4.0)
MCHC: 33.9 g/dL (ref 30.0–36.0)
MCV: 90.3 fl (ref 78.0–100.0)
Monocytes Absolute: 0.4 K/uL (ref 0.1–1.0)
Monocytes Relative: 6.9 % (ref 3.0–12.0)
Neutro Abs: 3 K/uL (ref 1.4–7.7)
Neutrophils Relative %: 50.9 % (ref 43.0–77.0)
Platelets: 235 K/uL (ref 150.0–400.0)
RBC: 5.57 Mil/uL (ref 4.22–5.81)
RDW: 13.7 % (ref 11.5–15.5)
WBC: 5.9 K/uL (ref 4.0–10.5)

## 2024-04-13 NOTE — Progress Notes (Signed)
 Subjective:    Patient ID: Glenn Mendoza, male    DOB: 16-Jan-1981, 43 y.o.   MRN: [id number]  HPI Pt in for wellness exam today. He is fasting. Working Engineer, structural in [place].   He states he does walk around neighbood twice a month. Very rare Pt has been working out. No sodas or sweet teas.  Coffee 2-3 cups a day. Not smoking. Former vaper but stopped.  Pt states occasionally on office visit hear and other placed mild high blood pressure. Today initial check 142/94.  Pt will get flu vaccine thru his work.   Hep b surface antibody today with routine labs as well as hep C antibody.   Review of Systems  Constitutional:  Negative for chills, fatigue and fever.  HENT:  Negative for congestion and facial swelling.   Respiratory:  Negative for cough, chest tightness, shortness of breath and wheezing.   Cardiovascular:  Negative for chest pain and palpitations.  Gastrointestinal:  Negative for abdominal pain, blood in stool and constipation.  Musculoskeletal:  Negative for back pain, joint swelling and neck pain.  Skin:  Negative for rash.       Skin tags per pt.  Neurological:  Negative for dizziness, speech difficulty, weakness and light-headedness.  Hematological:  Negative for adenopathy. Does not bruise/bleed easily.  Psychiatric/Behavioral:  Negative for behavioral problems and decreased concentration. The patient is not nervous/anxious.     Past Medical History:  Diagnosis Date   Acid reflux    Allergy    Anxiety    Depression    PTSD (post-traumatic stress disorder)      Social History   Socioeconomic History   Marital status: Married    Spouse name: Not on file   Number of children: Not on file   Years of education: Not on file   Highest education level: Associate degree: academic program  Occupational History   Not on file  Tobacco Use   Smoking status: Former    Current packs/day: 0.50    Average packs/day: 0.5 packs/day for 0.2 years (0.1 ttl pk-yrs)    Types:  Cigarettes   Smokeless tobacco: Never  Substance and Sexual Activity   Alcohol use: No   Drug use: Not on file   Sexual activity: Yes  Other Topics Concern   Not on file  Social History Narrative   Not on file   Social Drivers of Health   Financial Resource Strain: Low Risk  (04/12/2023)   Overall Financial Resource Strain (CARDIA)    Difficulty of Paying Living Expenses: Not hard at all  Food Insecurity: No Food Insecurity (04/12/2023)   Hunger Vital Sign    Worried About Running Out of Food in the Last Year: Never true    Ran Out of Food in the Last Year: Never true  Transportation Needs: No Transportation Needs (04/12/2023)   PRAPARE - Administrator, Civil Service (Medical): No    Lack of Transportation (Non-Medical): No  Physical Activity: Insufficiently Active (04/12/2023)   Exercise Vital Sign    Days of Exercise per Week: 1 day    Minutes of Exercise per Session: 30 min  Stress: Stress Concern Present (04/12/2023)   [employer] of Occupational Health - Occupational Stress Questionnaire    Feeling of Stress : To some extent  Social Connections: Socially Isolated (04/12/2023)   Social Connection and Isolation Panel    Frequency of Communication with Friends and Family: Never    Frequency of Social Gatherings with  Friends and Family: Once a week    Attends Religious Services: Never    Database administrator or Organizations: No    Attends Engineer, structural: Not on file    Marital Status: Married  Catering manager Violence: Not on file    No past surgical history on file.  Family History  Problem Relation Age of Onset   Depression Mother    Diabetes Mother    Alcohol abuse Father    Hypertension Father    Alcohol abuse Sister    Alcohol abuse Brother     Allergies  Allergen Reactions   Wellbutrin  [Bupropion ] Hives    On palms and soles   Codeine Itching and Anxiety    No current outpatient medications on file prior to visit.    No current facility-administered medications on file prior to visit.    BP 130/87   Pulse (!) 50   Temp 98 F (36.7 C)   Resp 18   Ht 5' 11 (1.803 m)   Wt 292 lb 6.4 oz (132.6 kg)   SpO2 99%   BMI 40.78 kg/m            Objective:   Physical Exam  General Mental Status- Alert. General Appearance- Not in acute distress.   Skin Rt upper back scapula area one moderate to large sized skin tag in appearance with moderate number of scattered moles.  Neck Carotid Arteries- Normal color. Moisture- Normal Moisture. No carotid bruits. No JVD.  Chest and Lung Exam Auscultation: Breath Sounds:-Normal.  Cardiovascular Auscultation:Rythm- Regular. Murmurs & Other Heart Sounds:Auscultation of the heart reveals- No Murmurs.  Abdomen Inspection:-Inspeection Normal. Palpation/Percussion:Note:No mass. Palpation and Percussion of the abdomen reveal- Non Tender, Non Distended + BS, no rebound or guarding.   Neurologic Cranial Nerve exam:- CN III-XII intact(No nystagmus), symmetric smile. Strength:- 5/5 equal and symmetric strength both upper and lower extremities.       Assessment & Plan:  For you wellness exam today I have ordered cbc, cmp, hep b surface antibody, hep c antibody screen and lipid panel.  Flu vaccine sept-October thru work.  Recommend exercise and healthy diet.  We will let you know lab results as they come in.  Follow up date appointment will be determined after lab review.    On recheck bp is less but was high. Explained low salt diet, weight loss and exercise blood pressure could stay controlled and not trend upward.      Kathryn Cosby, PA-C

## 2024-04-13 NOTE — Patient Instructions (Addendum)
 For you wellness exam today I have ordered cbc, cmp, hep b surface antibody, hep c antibody screen and lipid panel.  Flu vaccine sept-October thru work.  Recommend exercise and healthy diet.  We will let you know lab results as they come in.  Follow up date appointment will be determined after lab review.    On recheck bp is less but was high. Explained low salt diet, weight loss and exercise blood pressure could stay controlled and not trend upward.    Preventive Care 64-58 Years Old, Male Preventive care refers to lifestyle choices and visits with your health care provider that can promote health and wellness. Preventive care visits are also called wellness exams. What can I expect for my preventive care visit? Counseling During your preventive care visit, your health care provider may ask about your: Medical history, including: Past medical problems. Family medical history. Current health, including: Emotional well-being. Home life and relationship well-being. Sexual activity. Lifestyle, including: Alcohol, nicotine or tobacco, and drug use. Access to firearms. Diet, exercise, and sleep habits. Safety issues such as seatbelt and bike helmet use. Sunscreen use. Work and work Astronomer. Physical exam Your health care provider will check your: Height and weight. These may be used to calculate your BMI (body mass index). BMI is a measurement that tells if you are at a healthy weight. Waist circumference. This measures the distance around your waistline. This measurement also tells if you are at a healthy weight and may help predict your risk of certain diseases, such as type 2 diabetes and high blood pressure. Heart rate and blood pressure. Body temperature. Skin for abnormal spots. What immunizations do I need?  Vaccines are usually given at various ages, according to a schedule. Your health care provider will recommend vaccines for you based on your age, medical history,  and lifestyle or other factors, such as travel or where you work. What tests do I need? Screening Your health care provider may recommend screening tests for certain conditions. This may include: Lipid and cholesterol levels. Diabetes screening. This is done by checking your blood sugar (glucose) after you have not eaten for a while (fasting). Hepatitis B test. Hepatitis C test. HIV (human immunodeficiency virus) test. STI (sexually transmitted infection) testing, if you are at risk. Lung cancer screening. Prostate cancer screening. Colorectal cancer screening. Talk with your health care provider about your test results, treatment options, and if necessary, the need for more tests. Follow these instructions at home: Eating and drinking  Eat a diet that includes fresh fruits and vegetables, whole grains, lean protein, and low-fat dairy products. Take vitamin and mineral supplements as recommended by your health care provider. Do not drink alcohol if your health care provider tells you not to drink. If you drink alcohol: Limit how much you have to 0-2 drinks a day. Know how much alcohol is in your drink. In the [place] one drink equals one 12 oz bottle of beer (355 mL), one 5 oz glass of wine (148 mL), or one 1 oz glass of hard liquor (44 mL). Lifestyle Brush your teeth every morning and night with fluoride toothpaste. Floss one time each day. Exercise for at least 30 minutes 5 or more days each week. Do not use any products that contain nicotine or tobacco. These products include cigarettes, chewing tobacco, and vaping devices, such as e-cigarettes. If you need help quitting, ask your health care provider. Do not use drugs. If you are sexually active, practice safe sex. Use a  condom or other form of protection to prevent STIs. Take aspirin only as told by your health care provider. Make sure that you understand how much to take and what form to take. Work with your health care provider to  find out whether it is safe and beneficial for you to take aspirin daily. Find healthy ways to manage stress, such as: Meditation, yoga, or listening to music. Journaling. Talking to a trusted person. Spending time with friends and family. Minimize exposure to UV radiation to reduce your risk of skin cancer. Safety Always wear your seat belt while driving or riding in a vehicle. Do not drive: If you have been drinking alcohol. Do not ride with someone who has been drinking. When you are tired or distracted. While texting. If you have been using any mind-altering substances or drugs. Wear a helmet and other protective equipment during sports activities. If you have firearms in your house, make sure you follow all gun safety procedures. What's next? Go to your health care provider once a year for an annual wellness visit. Ask your health care provider how often you should have your eyes and teeth checked. Stay up to date on all vaccines. This information is not intended to replace advice given to you by your health care provider. Make sure you discuss any questions you have with your health care provider. Document Revised: 02/01/2021 Document Reviewed: 02/01/2021 Elsevier Patient Education  2024 [employer].

## 2024-04-13 NOTE — Addendum Note (Signed)
 Addended by: DORINA DALLAS HERO on: 04/13/2024 08:35 AM   Modules accepted: Orders

## 2024-04-14 ENCOUNTER — Ambulatory Visit: Payer: Self-pay | Admitting: Medical

## 2024-04-14 LAB — HEPATITIS C ANTIBODY: Hepatitis C Ab: NONREACTIVE

## 2024-04-14 LAB — HEPATITIS B SURFACE ANTIBODY, QUANTITATIVE: Hep B S AB Quant (Post): 84 m[IU]/mL (ref >=10)

## 2024-06-22 ENCOUNTER — Other Ambulatory Visit: Payer: Self-pay

## 2024-08-17 ENCOUNTER — Ambulatory Visit: Admitting: Physician Assistant

## 2024-08-17 ENCOUNTER — Encounter: Payer: Self-pay | Admitting: Physician Assistant

## 2024-08-17 VITALS — BP 169/108

## 2024-08-17 DIAGNOSIS — D225 Melanocytic nevi of trunk: Secondary | ICD-10-CM

## 2024-08-17 DIAGNOSIS — D485 Neoplasm of uncertain behavior of skin: Secondary | ICD-10-CM | POA: Diagnosis not present

## 2024-08-17 DIAGNOSIS — W908XXA Exposure to other nonionizing radiation, initial encounter: Secondary | ICD-10-CM

## 2024-08-17 DIAGNOSIS — D229 Melanocytic nevi, unspecified: Secondary | ICD-10-CM

## 2024-08-17 DIAGNOSIS — L853 Xerosis cutis: Secondary | ICD-10-CM | POA: Diagnosis not present

## 2024-08-17 DIAGNOSIS — Z1283 Encounter for screening for malignant neoplasm of skin: Secondary | ICD-10-CM | POA: Diagnosis not present

## 2024-08-17 DIAGNOSIS — L578 Other skin changes due to chronic exposure to nonionizing radiation: Secondary | ICD-10-CM

## 2024-08-17 DIAGNOSIS — L814 Other melanin hyperpigmentation: Secondary | ICD-10-CM | POA: Diagnosis not present

## 2024-08-17 DIAGNOSIS — L817 Pigmented purpuric dermatosis: Secondary | ICD-10-CM

## 2024-08-17 DIAGNOSIS — L918 Other hypertrophic disorders of the skin: Secondary | ICD-10-CM | POA: Diagnosis not present

## 2024-08-17 DIAGNOSIS — D1801 Hemangioma of skin and subcutaneous tissue: Secondary | ICD-10-CM

## 2024-08-17 NOTE — Patient Instructions (Addendum)

## 2024-08-17 NOTE — Progress Notes (Signed)
 "  New Patient Visit   Subjective  Glenn Mendoza is a 43 y.o. male NEW PATIENT who presents for the following:   Total Body Skin Exam (TBSE)  The patient reports he  has spots, moles and lesions to be evaluated, some may be new or changing and the patient may have concern these could be cancer. Patient has previously been treated by a dermatologist. Hx of Bx - benign. Denied family Hx of skin cancers. Patient does apply sunscreen and/or wears protective coverings.  The following portions of the chart were reviewed this encounter and updated as appropriate: medications, allergies, medical history  Review of Systems:  No other skin or systemic complaints except as noted in HPI or Assessment and Plan.  Objective  Well appearing patient in no apparent distress; mood and affect are within normal limits.  A full examination was performed including scalp, head, eyes, ears, nose, lips, neck, chest, axillae, abdomen, back, buttocks, bilateral upper extremities, bilateral lower extremities, hands, feet, fingers, toes, fingernails, and toenails. All findings within normal limits unless otherwise noted below.    Relevant exam findings are noted in the Assessment and Plan.   Right Upper Back 1.1 flesh colored nodule        Right Buttock 5mm irregular brown macole  Assessment & Plan   LENTIGINES, HEMANGIOMAS - Benign normal skin lesions - Benign-appearing - Call for any changes  BENIGN MELANOCYTIC NEVI - Tan-brown and/or pink-flesh-colored symmetric macules and papules - Benign appearing on exam today - Observation - Call clinic for new or changing moles - Recommend daily use of broad spectrum spf 30+ sunscreen to sun-exposed areas.   MILD ACTINIC DAMAGE - Chronic condition, secondary to cumulative UV/sun exposure - diffuse scaly erythematous macules with underlying dyspigmentation - Recommend daily broad spectrum sunscreen SPF 30+ to sun-exposed areas, reapply every 2 hours as  needed.  - Staying in the shade or wearing long sleeves, sun glasses (UVA+UVB protection) and wide brim hats (4-inch brim around the entire circumference of the hat) are also recommended for sun protection.  - Call for new or changing lesions.  SCHAMBERG'S PURPURA Exam: located at B/L foot  Treatment Plan: - Monitor  Xerosis - diffuse xerotic patches - recommend gentle, hydrating skin care - Rec. Amlactin, CeraVe SA/Rough & Bumpy - samples/coupon provided - gentle skin care handout given     SKIN CANCER SCREENING PERFORMED TODAY NEOPLASM OF UNCERTAIN BEHAVIOR OF SKIN (2) Right Upper Back - Epidermal / dermal shaving  Lesion diameter (cm):  1.1 Informed consent: discussed and consent obtained   Timeout: patient name, date of birth, surgical site, and procedure verified   Procedure prep:  Patient was prepped and draped in usual sterile fashion Prep type:  Isopropyl alcohol Anesthesia: the lesion was anesthetized in a standard fashion   Anesthetic:  1% lidocaine w/ epinephrine 1-100,000 buffered w/ 8.4% NaHCO3 Instrument used: flexible razor blade   Hemostasis achieved with: pressure, aluminum chloride and electrodesiccation   Outcome: patient tolerated procedure well   Post-procedure details: sterile dressing applied and wound care instructions given   Dressing type: bandage and petrolatum    Specimen 1 - Surgical pathology Differential Diagnosis: r/o skin tag v other  Check Margins: yes Right Buttock - Epidermal / dermal shaving  Lesion diameter (cm):  0.5 Informed consent: discussed and consent obtained   Timeout: patient name, date of birth, surgical site, and procedure verified   Procedure prep:  Patient was prepped and draped in usual sterile fashion Prep type:  Isopropyl alcohol Anesthesia: the lesion was anesthetized in a standard fashion   Anesthetic:  1% lidocaine w/ epinephrine 1-100,000 buffered w/ 8.4% NaHCO3 Instrument used: flexible razor blade    Hemostasis achieved with: pressure, aluminum chloride and electrodesiccation   Outcome: patient tolerated procedure well   Post-procedure details: sterile dressing applied and wound care instructions given   Dressing type: bandage and petrolatum    Specimen 2 - Surgical pathology Differential Diagnosis: r/o DN v MM  Check Margins: yes LENTIGINES   MULTIPLE BENIGN NEVI   ACTINIC SKIN DAMAGE   CHERRY ANGIOMA   SCREENING EXAM FOR SKIN CANCER   SCHAMBERG'S PURPURA   XEROSIS CUTIS    Return in about 1 year (around 08/17/2025) for TBSE.   Documentation: I have reviewed the above documentation for accuracy and completeness, and I agree with the above.  I, Shirron Maranda, CMA II, am acting as scribe for:  Jarelyn Bambach K, PA-C "

## 2024-08-18 LAB — SURGICAL PATHOLOGY

## 2024-08-23 ENCOUNTER — Ambulatory Visit: Payer: Self-pay | Admitting: Physician Assistant

## 2025-04-16 ENCOUNTER — Encounter: Admitting: Medical
